# Patient Record
Sex: Female | Born: 1965 | Race: Black or African American | Hispanic: No | Smoking: Never smoker
Health system: Southern US, Community
[De-identification: ages and names within clinical notes are randomized; demographics above are authoritative.]

## PROBLEM LIST (undated history)

## (undated) DIAGNOSIS — I10 Essential (primary) hypertension: Secondary | ICD-10-CM

## (undated) DIAGNOSIS — K219 Gastro-esophageal reflux disease without esophagitis: Secondary | ICD-10-CM

## (undated) DIAGNOSIS — T7840XA Allergy, unspecified, initial encounter: Secondary | ICD-10-CM

## (undated) HISTORY — PX: TUBAL LIGATION: SHX77

## (undated) HISTORY — DX: Essential (primary) hypertension: I10

## (undated) HISTORY — DX: Allergy, unspecified, initial encounter: T78.40XA

## (undated) SURGERY — COLONOSCOPY
Anesthesia: Moderate Sedation

---

## 2001-10-27 ENCOUNTER — Other Ambulatory Visit: Admission: RE | Admit: 2001-10-27 | Discharge: 2001-10-27 | Payer: Self-pay | Admitting: Family Medicine

## 2003-06-03 ENCOUNTER — Encounter: Payer: Self-pay | Admitting: Emergency Medicine

## 2003-06-03 ENCOUNTER — Emergency Department (HOSPITAL_COMMUNITY): Admission: EM | Admit: 2003-06-03 | Discharge: 2003-06-03 | Payer: Self-pay | Admitting: Emergency Medicine

## 2003-09-20 ENCOUNTER — Encounter: Payer: Self-pay | Admitting: Family Medicine

## 2003-09-20 ENCOUNTER — Ambulatory Visit (HOSPITAL_COMMUNITY): Admission: RE | Admit: 2003-09-20 | Discharge: 2003-09-20 | Payer: Self-pay | Admitting: Family Medicine

## 2004-11-17 ENCOUNTER — Ambulatory Visit: Payer: Self-pay | Admitting: Family Medicine

## 2005-01-06 ENCOUNTER — Ambulatory Visit: Payer: Self-pay | Admitting: Family Medicine

## 2005-02-05 ENCOUNTER — Ambulatory Visit: Payer: Self-pay | Admitting: Family Medicine

## 2005-07-01 ENCOUNTER — Ambulatory Visit: Payer: Self-pay | Admitting: Family Medicine

## 2005-07-05 ENCOUNTER — Emergency Department: Payer: Self-pay | Admitting: Unknown Physician Specialty

## 2005-07-05 ENCOUNTER — Other Ambulatory Visit: Payer: Self-pay

## 2005-07-08 ENCOUNTER — Ambulatory Visit: Payer: Self-pay | Admitting: Family Medicine

## 2006-01-21 ENCOUNTER — Ambulatory Visit: Payer: Self-pay | Admitting: Family Medicine

## 2006-03-13 ENCOUNTER — Encounter (INDEPENDENT_AMBULATORY_CARE_PROVIDER_SITE_OTHER): Payer: Self-pay | Admitting: *Deleted

## 2006-03-13 LAB — CONVERTED CEMR LAB: Pap Smear: NORMAL

## 2006-03-23 ENCOUNTER — Ambulatory Visit: Payer: Self-pay | Admitting: Family Medicine

## 2006-03-23 ENCOUNTER — Other Ambulatory Visit: Admission: RE | Admit: 2006-03-23 | Discharge: 2006-03-23 | Payer: Self-pay | Admitting: Family Medicine

## 2006-03-23 ENCOUNTER — Encounter (INDEPENDENT_AMBULATORY_CARE_PROVIDER_SITE_OTHER): Payer: Self-pay | Admitting: *Deleted

## 2006-05-17 ENCOUNTER — Ambulatory Visit: Payer: Self-pay | Admitting: Family Medicine

## 2006-06-28 ENCOUNTER — Emergency Department (HOSPITAL_COMMUNITY): Admission: EM | Admit: 2006-06-28 | Discharge: 2006-06-29 | Payer: Self-pay | Admitting: Emergency Medicine

## 2006-11-02 ENCOUNTER — Ambulatory Visit: Payer: Self-pay | Admitting: Family Medicine

## 2006-11-08 ENCOUNTER — Ambulatory Visit (HOSPITAL_COMMUNITY): Admission: RE | Admit: 2006-11-08 | Discharge: 2006-11-08 | Payer: Self-pay | Admitting: Family Medicine

## 2007-06-21 ENCOUNTER — Ambulatory Visit: Payer: Self-pay | Admitting: Family Medicine

## 2007-07-25 ENCOUNTER — Ambulatory Visit: Payer: Self-pay | Admitting: Family Medicine

## 2007-07-25 ENCOUNTER — Encounter: Payer: Self-pay | Admitting: Family Medicine

## 2007-07-25 ENCOUNTER — Other Ambulatory Visit: Admission: RE | Admit: 2007-07-25 | Discharge: 2007-07-25 | Payer: Self-pay | Admitting: Family Medicine

## 2007-07-25 ENCOUNTER — Encounter (INDEPENDENT_AMBULATORY_CARE_PROVIDER_SITE_OTHER): Payer: Self-pay | Admitting: *Deleted

## 2007-07-25 LAB — CONVERTED CEMR LAB
BUN: 19 mg/dL (ref 6–23)
Basophils Relative: 1 % (ref 0–1)
Calcium: 9.7 mg/dL (ref 8.4–10.5)
Chloride: 101 meq/L (ref 96–112)
HCT: 38.1 %
HCT: 38.1 % (ref 36.0–46.0)
Hemoglobin: 11.6 g/dL
Hemoglobin: 11.6 g/dL — ABNORMAL LOW (ref 12.0–15.0)
LDL Cholesterol: 109 mg/dL — ABNORMAL HIGH (ref 0–99)
MCHC: 30.4 g/dL
MCHC: 30.4 g/dL (ref 30.0–36.0)
MCV: 71.8 fL — ABNORMAL LOW (ref 78.0–100.0)
Monocytes Relative: 5 % (ref 3–11)
Platelets: 338 10*3/uL
Platelets: 338 10*3/uL (ref 150–400)
Potassium: 3.9 meq/L (ref 3.5–5.3)
RBC: 5.31 M/uL
RDW: 14.8 %
Triglycerides: 64 mg/dL (ref <150)
VLDL: 13 mg/dL (ref 0–40)
WBC: 6.6 10*3/uL
WBC: 6.6 10*3/uL (ref 4.0–10.5)

## 2007-07-26 ENCOUNTER — Encounter: Payer: Self-pay | Admitting: Family Medicine

## 2007-07-26 LAB — CONVERTED CEMR LAB
Folate: 5.1 ng/mL
Iron: 83 ug/dL (ref 42–145)
Retic Count, Absolute: 63.4 (ref 19.0–186.0)
Saturation Ratios: 27 % (ref 20–55)
UIBC: 222 ug/dL

## 2007-11-15 ENCOUNTER — Ambulatory Visit: Payer: Self-pay | Admitting: Family Medicine

## 2007-11-17 ENCOUNTER — Ambulatory Visit (HOSPITAL_COMMUNITY): Admission: RE | Admit: 2007-11-17 | Discharge: 2007-11-17 | Payer: Self-pay | Admitting: Family Medicine

## 2007-12-19 ENCOUNTER — Encounter: Payer: Self-pay | Admitting: Family Medicine

## 2007-12-19 DIAGNOSIS — N949 Unspecified condition associated with female genital organs and menstrual cycle: Secondary | ICD-10-CM

## 2007-12-19 DIAGNOSIS — I10 Essential (primary) hypertension: Secondary | ICD-10-CM | POA: Insufficient documentation

## 2007-12-19 DIAGNOSIS — D649 Anemia, unspecified: Secondary | ICD-10-CM | POA: Insufficient documentation

## 2007-12-19 DIAGNOSIS — R5383 Other fatigue: Secondary | ICD-10-CM

## 2007-12-19 DIAGNOSIS — N938 Other specified abnormal uterine and vaginal bleeding: Secondary | ICD-10-CM | POA: Insufficient documentation

## 2007-12-19 DIAGNOSIS — R5381 Other malaise: Secondary | ICD-10-CM | POA: Insufficient documentation

## 2007-12-19 DIAGNOSIS — J309 Allergic rhinitis, unspecified: Secondary | ICD-10-CM | POA: Insufficient documentation

## 2007-12-19 DIAGNOSIS — E669 Obesity, unspecified: Secondary | ICD-10-CM | POA: Insufficient documentation

## 2007-12-19 DIAGNOSIS — G43909 Migraine, unspecified, not intractable, without status migrainosus: Secondary | ICD-10-CM | POA: Insufficient documentation

## 2008-04-17 ENCOUNTER — Ambulatory Visit: Payer: Self-pay | Admitting: Family Medicine

## 2008-04-23 ENCOUNTER — Encounter (INDEPENDENT_AMBULATORY_CARE_PROVIDER_SITE_OTHER): Payer: Self-pay | Admitting: *Deleted

## 2008-07-25 ENCOUNTER — Ambulatory Visit: Payer: Self-pay | Admitting: Family Medicine

## 2008-07-25 ENCOUNTER — Encounter: Payer: Self-pay | Admitting: Family Medicine

## 2008-07-25 ENCOUNTER — Other Ambulatory Visit: Admission: RE | Admit: 2008-07-25 | Discharge: 2008-07-25 | Payer: Self-pay | Admitting: Family Medicine

## 2008-07-25 DIAGNOSIS — M1710 Unilateral primary osteoarthritis, unspecified knee: Secondary | ICD-10-CM

## 2008-07-25 DIAGNOSIS — IMO0002 Reserved for concepts with insufficient information to code with codable children: Secondary | ICD-10-CM | POA: Insufficient documentation

## 2008-07-26 ENCOUNTER — Encounter: Payer: Self-pay | Admitting: Family Medicine

## 2008-07-26 LAB — CONVERTED CEMR LAB: Retic Ct Pct: 1.1 % (ref 0.4–3.1)

## 2008-07-28 LAB — CONVERTED CEMR LAB
ALT: 12 units/L (ref 0–35)
AST: 16 units/L (ref 0–37)
Albumin: 4.1 g/dL (ref 3.5–5.2)
Alkaline Phosphatase: 62 units/L (ref 39–117)
Calcium: 8.8 mg/dL (ref 8.4–10.5)
Eosinophils Absolute: 0.1 10*3/uL (ref 0.0–0.7)
Eosinophils Relative: 2 % (ref 0–5)
Glucose, Bld: 94 mg/dL (ref 70–99)
Hemoglobin: 10.8 g/dL — ABNORMAL LOW (ref 12.0–15.0)
Indirect Bilirubin: 0.3 mg/dL (ref 0.0–0.9)
Lymphs Abs: 2.5 10*3/uL (ref 0.7–4.0)
MCV: 71.5 fL — ABNORMAL LOW (ref 78.0–100.0)
Monocytes Absolute: 0.4 10*3/uL (ref 0.1–1.0)
Neutro Abs: 2.3 10*3/uL (ref 1.7–7.7)
RBC: 5.02 M/uL (ref 3.87–5.11)
Total Bilirubin: 0.4 mg/dL (ref 0.3–1.2)
Total CHOL/HDL Ratio: 2.7
WBC: 5.3 10*3/uL (ref 4.0–10.5)

## 2008-09-28 ENCOUNTER — Emergency Department: Payer: Self-pay | Admitting: Emergency Medicine

## 2008-11-19 ENCOUNTER — Ambulatory Visit: Payer: Self-pay | Admitting: Family Medicine

## 2009-03-05 ENCOUNTER — Ambulatory Visit: Payer: Self-pay | Admitting: Family Medicine

## 2009-03-09 DIAGNOSIS — J0110 Acute frontal sinusitis, unspecified: Secondary | ICD-10-CM | POA: Insufficient documentation

## 2009-03-11 ENCOUNTER — Ambulatory Visit (HOSPITAL_COMMUNITY): Admission: RE | Admit: 2009-03-11 | Discharge: 2009-03-11 | Payer: Self-pay | Admitting: Family Medicine

## 2009-06-02 ENCOUNTER — Encounter: Payer: Self-pay | Admitting: Family Medicine

## 2009-07-15 ENCOUNTER — Encounter: Payer: Self-pay | Admitting: Family Medicine

## 2009-07-16 ENCOUNTER — Encounter: Payer: Self-pay | Admitting: Family Medicine

## 2009-07-16 LAB — CONVERTED CEMR LAB
BUN: 11 mg/dL (ref 6–23)
Basophils Absolute: 0 10*3/uL (ref 0.0–0.1)
CO2: 24 meq/L (ref 19–32)
Eosinophils Absolute: 0.1 10*3/uL (ref 0.0–0.7)
Glucose, Bld: 93 mg/dL (ref 70–99)
HCT: 35.8 % — ABNORMAL LOW (ref 36.0–46.0)
Hemoglobin: 10.6 g/dL — ABNORMAL LOW (ref 12.0–15.0)
Lymphs Abs: 2.3 10*3/uL (ref 0.7–4.0)
Monocytes Relative: 7 % (ref 3–12)
Potassium: 4.5 meq/L (ref 3.5–5.3)
RBC: 4.94 M/uL (ref 3.87–5.11)
Total CHOL/HDL Ratio: 2.3
WBC: 5.8 10*3/uL (ref 4.0–10.5)

## 2009-07-29 ENCOUNTER — Ambulatory Visit: Payer: Self-pay | Admitting: Family Medicine

## 2009-07-29 ENCOUNTER — Encounter: Payer: Self-pay | Admitting: Family Medicine

## 2009-07-29 ENCOUNTER — Other Ambulatory Visit: Admission: RE | Admit: 2009-07-29 | Discharge: 2009-07-29 | Payer: Self-pay | Admitting: Family Medicine

## 2009-07-29 DIAGNOSIS — E049 Nontoxic goiter, unspecified: Secondary | ICD-10-CM | POA: Insufficient documentation

## 2009-08-01 ENCOUNTER — Ambulatory Visit (HOSPITAL_COMMUNITY): Admission: RE | Admit: 2009-08-01 | Discharge: 2009-08-01 | Payer: Self-pay | Admitting: Family Medicine

## 2009-12-30 ENCOUNTER — Ambulatory Visit: Payer: Self-pay | Admitting: Family Medicine

## 2010-01-11 DIAGNOSIS — F4321 Adjustment disorder with depressed mood: Secondary | ICD-10-CM | POA: Insufficient documentation

## 2010-03-13 ENCOUNTER — Ambulatory Visit (HOSPITAL_COMMUNITY): Admission: RE | Admit: 2010-03-13 | Discharge: 2010-03-13 | Payer: Self-pay | Admitting: Family Medicine

## 2010-03-18 ENCOUNTER — Ambulatory Visit: Payer: Self-pay | Admitting: Family Medicine

## 2010-03-23 DIAGNOSIS — IMO0002 Reserved for concepts with insufficient information to code with codable children: Secondary | ICD-10-CM | POA: Insufficient documentation

## 2010-07-31 ENCOUNTER — Other Ambulatory Visit: Admission: RE | Admit: 2010-07-31 | Discharge: 2010-07-31 | Payer: Self-pay | Admitting: Family Medicine

## 2010-07-31 ENCOUNTER — Ambulatory Visit: Payer: Self-pay | Admitting: Family Medicine

## 2010-07-31 LAB — CONVERTED CEMR LAB: OCCULT 1: NEGATIVE

## 2010-08-04 ENCOUNTER — Encounter: Payer: Self-pay | Admitting: Family Medicine

## 2010-08-04 LAB — CONVERTED CEMR LAB
BUN: 16 mg/dL (ref 6–23)
Basophils Absolute: 0 10*3/uL (ref 0.0–0.1)
Chloride: 103 meq/L (ref 96–112)
Cholesterol: 140 mg/dL (ref 0–200)
Creatinine, Ser: 0.57 mg/dL (ref 0.40–1.20)
Eosinophils Relative: 2 % (ref 0–5)
Glucose, Bld: 95 mg/dL (ref 70–99)
HDL: 62 mg/dL (ref >39)
LDL Cholesterol: 69 mg/dL (ref 0–99)
Lymphocytes Relative: 38 % (ref 12–46)
Lymphs Abs: 2.8 10*3/uL (ref 0.7–4.0)
MCV: 72.3 fL — ABNORMAL LOW (ref 78.0–100.0)
Monocytes Absolute: 0.5 10*3/uL (ref 0.1–1.0)
Monocytes Relative: 7 % (ref 3–12)
Potassium: 3.8 meq/L (ref 3.5–5.3)
RBC: 4.87 M/uL (ref 3.87–5.11)
RDW: 15.1 % (ref 11.5–15.5)
Total CHOL/HDL Ratio: 2.3
WBC: 7.5 10*3/uL (ref 4.0–10.5)

## 2010-08-05 ENCOUNTER — Encounter: Payer: Self-pay | Admitting: Family Medicine

## 2010-08-05 LAB — CONVERTED CEMR LAB
Folate: 11.1 ng/mL
TIBC: 289 ug/dL (ref 250–470)
UIBC: 213 ug/dL
Vitamin B-12: 974 pg/mL — ABNORMAL HIGH (ref 211–911)

## 2010-08-06 ENCOUNTER — Encounter: Payer: Self-pay | Admitting: Physician Assistant

## 2010-11-24 ENCOUNTER — Ambulatory Visit: Payer: Self-pay | Admitting: Family Medicine

## 2010-12-27 ENCOUNTER — Encounter: Payer: Self-pay | Admitting: Family Medicine

## 2011-01-07 NOTE — Assessment & Plan Note (Signed)
Summary: physical   Vital Signs:  Patient profile:   45 year old female Menstrual status:  regular Height:      63 inches Weight:      185.75 pounds BMI:     33.02 O2 Sat:      98 % on Room air Pulse rate:   91 / minute Pulse rhythm:   regular Resp:     16 per minute BP sitting:   122 / 88  (left arm)  Vitals Entered By: Adella Hare LPN (July 31, 2010 9:00 AM)  Nutrition Counseling: Patient's BMI is greater than 25 and therefore counseled on weight management options.  O2 Flow:  Room air CC: physical Is Patient Diabetic? No Pain Assessment Patient in pain? no       Vision Screening:Left eye w/o correction: 20 / 15 Right Eye w/o correction: 20 / 25 Both eyes w/o correction:  20/ 13  Color vision testing: normal      Vision Entered By: Adella Hare LPN (July 31, 2010 9:01 AM)   CC:  physical.  History of Present Illness: Reports  that she has been doing fairly well. Apart from being under excessive stress because of the behavior of one of her children, she has no real concerns. 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 Denies recent fever or chills. Denies sinus pressure, nasal congestion , ear pain or sore throat. Denies chest congestion, or cough productive of sputum. Denies chest pain, palpitations, PND, orthopnea or leg swelling. Denies abdominal pain, nausea, vomitting, diarrhea or constipation. Denies change in bowel movements or bloody stool. Denies dysuria , frequency, incontinence or hesitancy. Denies  joint pain, swelling, or reduced mobility. Denies headaches, vertigo, seizures.  Denies  rash, lesions, or itch.     Current Medications (verified): 1)  Zestoretic 20-12.5 Mg Tabs (Lisinopril-Hydrochlorothiazide) .... One and A Half Tablets Every Morning 2)  Claritin 10 Mg Tabs (Loratadine) .... Take 1 Tablet By Mouth Once A Day  Allergies (verified): No Known Drug Allergies  Review of Systems      See  HPI General:  Complains of fatigue and sleep disorder. Eyes:  Denies blurring, discharge, eye pain, and red eye. Endo:  Denies cold intolerance, excessive thirst, excessive urination, and heat intolerance. Heme:  Denies abnormal bruising and bleeding. Allergy:  Complains of seasonal allergies; denies hives or rash and itching eyes.  Physical Exam  General:  Well-developed,well-nourished,in no acute distress; alert,appropriate and cooperative throughout examination Head:  Normocephalic and atraumatic without obvious abnormalities. No apparent alopecia or balding. Eyes:  No corneal or conjunctival inflammation noted. EOMI. Perrla. Funduscopic exam benign, without hemorrhages, exudates or papilledema. Vision grossly normal. Ears:  External ear exam shows no significant lesions or deformities.  Otoscopic examination reveals clear canals, tympanic membranes are intact bilaterally without bulging, retraction, inflammation or discharge. Hearing is grossly normal bilaterally. Nose:  External nasal examination shows no deformity or inflammation. Nasal mucosa are pink and moist without lesions or exudates. Mouth:  pharynx pink and moist and fair dentition.   Neck:  No deformities, masses, or tenderness noted. Chest Wall:  No deformities, masses, or tenderness noted. Breasts:  No mass, nodules, thickening, tenderness, bulging, retraction, inflamation, nipple discharge or skin changes noted.   Lungs:  Normal respiratory effort, chest expands symmetrically. Lungs are clear to auscultation, no crackles or wheezes. Heart:  Normal rate and regular rhythm. S1 and S2 normal without gallop, murmur, click, rub or other extra sounds. Abdomen:  Bowel sounds  positive,abdomen soft and non-tender without masses, organomegaly or hernias noted. Rectal:  No external abnormalities noted. Normal sphincter tone. No rectal masses or tenderness.guaic neg stoolGuaic nefggative stool Genitalia:  Normal introitus for age, no  external lesions, no vaginal discharge, mucosa pink and moist, no vaginal or cervical lesions, no vaginal atrophy, no friaility or hemorrhage, normal uterus size and position, no adnexal masses or tenderness Msk:  No deformity or scoliosis noted of thoracic or lumbar spine.   Pulses:  R and L carotid,radial,femoral,dorsalis pedis and posterior tibial pulses are full and equal bilaterally Extremities:  No clubbing, cyanosis, edema, or deformity noted with normal full range of motion of all joints.   Neurologic:  No cranial nerve deficits noted. Station and gait are normal. Plantar reflexes are down-going bilaterally. DTRs are symmetrical throughout. Sensory, motor and coordinative functions appear intact. Skin:  Intact without suspicious lesions or rashes Cervical Nodes:  No lymphadenopathy noted Axillary Nodes:  No palpable lymphadenopathy Inguinal Nodes:  No significant adenopathy Psych:  Cognition and judgment appear intact. Alert and cooperative with normal attention span and concentration. No apparent delusions, illusions, hallucinations   Impression & Recommendations:  Problem # 1:  HYPERTENSION (ICD-401.9) Assessment Improved  Her updated medication list for this problem includes:    Zestoretic 20-12.5 Mg Tabs (Lisinopril-hydrochlorothiazide) ..... One and a half tablets every morning  BP today: 122/88 Prior BP: 140/90 (03/18/2010)  Labs Reviewed: K+: 4.5 (07/15/2009) Creat: : 0.54 (07/15/2009)   Chol: 150 (07/15/2009)   HDL: 64 (07/15/2009)   LDL: 76 (07/15/2009)   TG: 48 (07/15/2009)  Problem # 2:  ALLERGIC RHINITIS (ICD-477.9) Assessment: Improved  Her updated medication list for this problem includes:    Claritin 10 Mg Tabs (Loratadine) .Marland Kitchen... Take 1 tablet by mouth once a day  Problem # 3:  OBESITY (ICD-278.00) Assessment: Improved  Ht: 63 (07/31/2010)   Wt: 185.75 (07/31/2010)   BMI: 33.02 (07/31/2010)  Complete Medication List: 1)  Zestoretic 20-12.5 Mg Tabs  (Lisinopril-hydrochlorothiazide) .... One and a half tablets every morning 2)  Claritin 10 Mg Tabs (Loratadine) .... Take 1 tablet by mouth once a day  Other Orders: Hemoccult Guaiac-1 spec.(in office) ([id number]) Pap Smear ([address])  Patient Instructions: 1)  Please schedule a follow-up appointment in 4.5 months. 2)  It is important that you exercise regularly at least 40 minutes 6 times a week. If you develop chest pain, have severe difficulty breathing, or feel very tired , stop exercising immediately and seek medical attention. 3)  You need to lose weight. Consider a lower calorie diet and regular exercise. Congrats on weight loss 4)  I hope that things improve in your life soon. 5)  Keep up the softball!!   Laboratory Results  Date/Time Received: July 31, 2010 10:34 AM  Date/Time Reported: July 31, 2010 10:34 AM   Stool - Occult Blood Hemmoccult #1: negative Date: 07/31/2010 Comments: 50201 10L 2/13 118 10/12

## 2011-01-07 NOTE — Assessment & Plan Note (Signed)
Summary: office visit   Vital Signs:  Patient profile:   45 year old female Menstrual status:  regular Height:      63 inches Weight:      198.75 pounds BMI:     35.33 O2 Sat:      95 % on Room air Pulse rate:   96 / minute Pulse rhythm:   regular Resp:     16 per minute BP sitting:   120 / 84  (right arm)  Vitals Entered By: Everitt Amber (December 30, 2009 9:52 AM)  Nutrition Counseling: Patient's BMI is greater than 25 and therefore counseled on weight management options.  O2 Flow:  Room air CC: Follow up chronic problems   CC:  Follow up chronic problems.  History of Present Illness: Reports  thatshe has not been doing well since the unexpected loss of herspouse in December. . Denies recent fever or chills. Denies sinus pressure, nasal congestion , ear pain or sore throat. Denies chest congestion, or cough productive of sputum. Denies chest pain, palpitations, PND, orthopnea or leg swelling. Denies abdominal pain, nausea, vomitting, diarrhea or constipation. Denies change in bowel movements or bloody stool. Denies dysuria , frequency, incontinence or hesitancy. Denies  joint pain, swelling, or reduced mobility. Denies headaches, vertigo, seizures. Reports depression, she is not suicidal orhomicidal, sh has family support from her children.. Denies  rash, lesions, or itch.     Preventive Screening-Counseling & Management  Alcohol-Tobacco     Smoking Status: never  Current Medications (verified): 1)  Zestoretic 20-12.5 Mg Tabs (Lisinopril-Hydrochlorothiazide) .... One and A Half Tablets Every Morning  Allergies (verified): No Known Drug Allergies  Social History: widowed in 11/2009 Never Smoked Alcohol use-no Drug use-no Employed 2 children  Review of Systems      See HPI Eyes:  Denies blurring and discharge. Neuro:  Denies headaches, seizures, and sensation of room spinning. Endo:  Denies cold intolerance, excessive hunger, excessive thirst, excessive  urination, heat intolerance, polyuria, and weight change. Heme:  Denies abnormal bruising and bleeding. Allergy:  Denies hives or rash and sneezing.  Physical Exam  General:  Well-developed,well-nourished,in no acute distress; alert,appropriate and cooperative throughout examination HEENT: No facial asymmetry,  EOMI, No sinus tenderness, TM's Clear, oropharynx  pink and moist.   Chest: Clear to auscultation bilaterally.  CVS: S1, S2, No murmurs, No S3.   Abd: Soft, Nontender.  MS: Adequate ROM spine, hips, shoulders and knees.  Ext: No edema.   CNS: CN 2-12 intact, power tone and sensation normal throughout.   Skin: Intact, no visible lesions or rashes.  Psych: Good eye contact, flat affect.  Memory intact,  depressed appearing.    Impression & Recommendations:  Problem # 1:  OBESITY (ICD-278.00) Assessment Unchanged  Ht: 63 (12/30/2009)   Wt: 198.75 (12/30/2009)   BMI: 35.33 (12/30/2009)  Problem # 2:  HYPERTENSION (ICD-401.9) Assessment: Unchanged  Her updated medication list for this problem includes:    Zestoretic 20-12.5 Mg Tabs (Lisinopril-hydrochlorothiazide) ..... One and a half tablets every morning  Orders: T-Basic Metabolic Panel [id number])  BP today: 120/84 Prior BP: 122/80 (07/29/2009)  Labs Reviewed: K+: 4.5 (07/15/2009) Creat: : 0.54 (07/15/2009)   Chol: 150 (07/15/2009)   HDL: 64 (07/15/2009)   LDL: 76 (07/15/2009)   TG: 48 (07/15/2009)  Problem # 3:  MOURNING (ICD-309.0) pt ventillated for approx 10 mins, at this time she does not require  mental health referral  Complete Medication List: 1)  Zestoretic 20-12.5 Mg Tabs (Lisinopril-hydrochlorothiazide) .... One  and a half tablets every morning  Other Orders: T-CBC w/Diff ([id number]) T- * Misc. Laboratory test [id number])  Patient Instructions: 1)  cPE end August after the 26th 2)  It is important that you exercise regularly at least 20 minutes 5 times a week. If you develop chest pain, have  severe difficulty breathing, or feel very tired , stop exercising immediately and seek medical attention. 3)  You need to lose weight. Consider a lower calorie diet and regular exercise.  4)  BMP prior to visit, ICD-9: 5)  CBC w/ Diff prior to visit, ICD-9:   and anemia panel Prescriptions: ZESTORETIC 20-12.5 MG TABS (LISINOPRIL-HYDROCHLOROTHIAZIDE) one and a half tablets every morning  #45 x 6   Entered by:   Everitt Amber   Authorized by:   Syliva Overman MD   Signed by:   Everitt Amber on 12/30/2009   Method used:   Electronically to        [facility] [address] (retail)       [address], [address]       [place], [place]  [address]       Ph: [phone]       Fax: [phone]   RxID:   [id number]

## 2011-01-07 NOTE — Assessment & Plan Note (Signed)
Summary: OV   Vital Signs:  Patient profile:   45 year old female Menstrual status:  regular Height:      63 inches Weight:      194.25 pounds BMI:     34.53 O2 Sat:      97 % on Room air Pulse rate:   101 / minute Pulse rhythm:   regular Resp:     16 per minute BP sitting:   140 / 90  (left arm) Cuff size:   large  Vitals Entered By: Everitt Amber LPN (March 18, 2010 9:45 AM)  Nutrition Counseling: Patient's BMI is greater than 25 and therefore counseled on weight management options.  O2 Flow:  Room air CC: she was at work [place] and she had bent over to pull something at work and she felt a pop in the left side of her lower back and now she has pain there and its going all the way down her left leg Pain Assessment Patient in pain? yes     Location: left back and leg Intensity: 8 Type: aching Onset of pain  wednesday    CC:  she was at work [place] and she had bent over to pull something at work and she felt a pop in the left side of her lower back and now she has pain there and its going all the way down her left leg.  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 She has used ibuprofen , pain keeps her up at night.    Current Medications (verified): 1)  Zestoretic 20-12.5 Mg Tabs (Lisinopril-Hydrochlorothiazide) .... One and A Half Tablets Every Morning  Allergies (verified): No Known Drug Allergies  Review of Systems General:  Complains of fatigue and sleep disorder; denies chills and fever; still grieving her spouse's loss feels lonely often. Eyes:  Denies blurring, discharge, and red eye. ENT:  Complains of nasal congestion and nosebleeds; denies decreased hearing, earache, postnasal drainage, sinus pressure, and sore throat. CV:  Denies chest pain or discomfort,  palpitations, shortness of breath with exertion, and swelling of feet. Resp:  Denies cough and sputum productive. GI:  Denies abdominal pain, constipation, diarrhea, nausea, and vomiting. GU:  Denies dysuria and urinary frequency. MS:  Complains of low back pain; left SI joint pain radiating down post left buttock to mudthigh  x 1 week after hurting her back on the job, Some numbness and weakness reported. Derm:  Denies itching and rash. Neuro:  Denies headaches, seizures, and sensation of room spinning. Psych:  Complains of anxiety and depression; denies irritability, mental problems, suicidal thoughts/plans, thoughts of violence, and unusual visions or sounds. Endo:  Denies cold intolerance, excessive hunger, excessive thirst, excessive urination, heat intolerance, polyuria, and weight change. Heme:  Denies abnormal bruising and bleeding. Allergy:  Complains of itching eyes and seasonal allergies; runny nose allergic to pollen.  Physical Exam  General:  Well-developed,well-nourished,in no acute distress; alert,appropriate and cooperative throughout examination HEENT: No facial asymmetry,  EOMI, No sinus tenderness, TM's Clear, oropharynx  pink and moist.   Chest: Clear to auscultation bilaterally.  CVS: S1, S2, No murmurs, No S3.   Abd: Soft, Nontender.  ZO:[id number]  ROM lumbosacral  spine,ADEQUATE in  hips, shoulders and knees.  Ext: No edema.   CNS: CN 2-12 intact, power  tone and sensation normal throughout.   Skin: Intact, no visible lesions or rashes.  Psych: Good eye contact, normal affect.  Memory intact, not anxious or depressed appearing.    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 One and a half tablets every morning  Orders: T-Basic Metabolic Panel [id number])  BP today: 140/90, probably due to pain and lack of sleep Prior BP: 120/84 (12/30/2009)  Labs Reviewed: K+: 4.5 (07/15/2009) Creat: : 0.54 (07/15/2009)   Chol: 150 (07/15/2009)   HDL: 64 (07/15/2009)   LDL: 76 (07/15/2009)   TG: 48 (07/15/2009)  Problem # 3:  OBESITY (ICD-278.00) Assessment: Unchanged  Ht: 63 (03/18/2010)   Wt: 194.25 (03/18/2010)   BMI: 34.53 (03/18/2010)  Complete Medication List: 1)  Zestoretic 20-12.5 Mg Tabs (Lisinopril-hydrochlorothiazide) .... One and a half tablets every morning 2)  Claritin 10 Mg Tabs (Loratadine) .... Take 1 tablet by mouth once a day 3)  Prednisone (pak) 5 Mg Tabs (Prednisone) .... Use as directed 4)  Ibuprofen 800 Mg Tabs (Ibuprofen) .... Take 1 tablet by mouth three times a day 5)  Tramadol Hcl 50 Mg Tabs (Tramadol hcl) .... Take 1 tab by mouth at bedtime as needed  Other Orders: T-Lipid Profile ([id number]) T-CBC w/Diff ([id number]) T-TSH [id number])  Patient Instructions: 1)  CPE due 8/25 or after. 2)  You are being treated for acute sciatic pain. 3)  pls take all meds prescribed. 4)  Pls call iof symptoms worsen 5)  It is important that you exercise regularly at least 20 minutes 5 times a week. If you develop chest pain, have severe difficulty breathing, or feel very tired , stop exercising immediately and seek medical attention. 6)  You need to lose weight. Consider a lower calorie diet and regular exercise.  7)  BMP prior to visit, ICD-9: 8)  Lipid Panel prior to visit, ICD-9: 9)  TSH prior to visit, ICD-9:   fastinglabs mid August 10)  CBC w/ Diff prior to visit, ICD-9: Prescriptions: TRAMADOL HCL 50 MG TABS (TRAMADOL HCL) Take 1 tab by mouth at bedtime as needed  #30 x 0   Entered and Authorized by:   Syliva Overman MD   Signed by:   Syliva Overman MD on 03/18/2010   Method used:    Electronically to        [facility] [address] (retail)       [address], [address]       [place], [place]  [address]       Ph: [phone]       Fax: [phone]   RxID:   [id number] IBUPROFEN 800 MG TABS (IBUPROFEN) Take 1 tablet by mouth three times a day  #30 x 0   Entered and Authorized by:   Syliva Overman MD   Signed by:   Syliva Overman MD on 03/18/2010   Method used:   Electronically to        [facility] [address] (retail)       [address], [address]       [place], [place]  [address]       Ph: [phone]  Fax: [phone]   RxID:   [id number] PREDNISONE (PAK) 5 MG TABS (PREDNISONE) Use as directed  #21 x 0   Entered and Authorized by:   Syliva Overman MD   Signed by:   Syliva Overman MD on 03/18/2010   Method used:   Electronically to        [facility] [address] (retail)       [address], [address]       [place], [place]  [address]       Ph: [phone]       Fax: [phone]   RxID:   [id number] CLARITIN 10 MG TABS (LORATADINE) Take 1 tablet by mouth once a day  #30 x 3   Entered and Authorized by:   Syliva Overman MD   Signed by:   Syliva Overman MD on 03/18/2010   Method used:   Electronically to        [facility] [address] (retail)       [address], [address]       [place], [place]  [address]       Ph: [phone]       Fax: [phone]   RxID:   [id number]

## 2011-01-07 NOTE — Letter (Signed)
Summary: Pap Smear, Normal Letter, [facility]  [address]   [place], [place] [address]   Phone: [phone]  Fax: [phone]          August 06, 2010    Dear: Kari Mason    I am pleased to notify you that your PAP smear was normal.  You will need your next PAP smear in:     ____ 3 Months    ____ 6 Months    ____ 12 Months    Please call the office at our office number above, to schedule your next appointment.    Sincerely,     [facility]

## 2011-01-07 NOTE — Letter (Signed)
Summary: LAB ADD ON  LAB ADD ON   Imported By: Lind Guest 08/05/2010 13:06:07  _____________________________________________________________________  External Attachment:    Type:   Image     Comment:   External Document

## 2011-01-13 NOTE — Assessment & Plan Note (Signed)
Summary: office visit   Vital Signs:  Patient profile:   45 year old female Menstrual status:  regular Height:      63 inches Weight:      179.50 pounds BMI:     31.91 O2 Sat:      99 % Pulse rate:   80 / minute Resp:     16 per minute BP sitting:   120 / 92  (left arm) Cuff size:   large  Vitals Entered By: Everitt Amber LPN (December 23, 2010 3:40 PM)  Nutrition Counseling: Patient's BMI is greater than 25 and therefore counseled on weight management options. CC: Follow up chronic problems   CC:  Follow up chronic problems.  History of Present Illness: Reports  that she has been   doing well. Denies recent fever or chills.  Denies chest congestion, or cough productive of sputum. Denies chest pain, palpitations, PND, orthopnea or leg swelling. Denies abdominal pain, nausea, vomitting, diarrhea or constipation. Denies change in bowel movements or bloody stool. Denies dysuria , frequency, incontinence or hesitancy. Denies  joint pain, swelling, or reduced mobility. Denies headaches, vertigo, seizures. Denies depression, anxiety or insomnia. Denies  rash, lesions, or itch.     Current Medications (verified): 1)  Zestoretic 20-12.5 Mg Tabs (Lisinopril-Hydrochlorothiazide) .... One and A Half Tablets Every Morning 2)  Claritin 10 Mg Tabs (Loratadine) .... Take 1 Tablet By Mouth Once A Day  Allergies (verified): No Known Drug Allergies  Review of Systems      See HPI Eyes:  Denies blurring and discharge. ENT:  Complains of postnasal drainage and sinus pressure; 4 day h/o frontal presure with green drainage. Endo:  Denies cold intolerance, excessive hunger, excessive thirst, excessive urination, and heat intolerance. Heme:  Denies abnormal bruising and bleeding. Allergy:  Denies hives or rash and itching eyes.  Physical Exam  General:  Well-developed,well-nourished,in no acute distress; alert,appropriate and cooperative throughout examination HEENT: No facial  asymmetry,  EOMI,frontal sinus tenderness, TM's Clear, oropharynx  pink and moist.   Chest: Clear to auscultation bilaterally.  CVS: S1, S2, No murmurs, No S3.   Abd: Soft, Nontender.  MS: Adequate ROM spine, hips, shoulders and knees.  Ext: No edema.   CNS: CN 2-12 intact, power tone and sensation normal throughout.   Skin: Intact, no visible lesions or rashes.  Psych: Good eye contact, normal affect.  Memory intact, not anxious or depressed appearing.    Impression & Recommendations:  Problem # 1:  HYPERTENSION (ICD-401.9) Assessment Unchanged  The following medications were removed from the medication list:    Zestoretic 20-12.5 Mg Tabs (Lisinopril-hydrochlorothiazide) ..... One and a half tablets every morning Her updated medication list for this problem includes:    Zestoretic 20-12.5 Mg Tabs (Lisinopril-hydrochlorothiazide) ..... One and a half tablets once daily  Orders: T-Basic Metabolic Panel [id number])  BP today: 120/92 Prior BP: 122/88 (07/31/2010)  Labs Reviewed: K+: 3.8 (07/31/2010) Creat: : 0.57 (07/31/2010)   Chol: 140 (07/31/2010)   HDL: 62 (07/31/2010)   LDL: 69 (07/31/2010)   TG: 45 (07/31/2010)  Problem # 2:  OBESITY (ICD-278.00) Assessment: Improved  Ht: 63 (12/23/2010)   Wt: 179.50 (12/23/2010)   BMI: 31.91 (12/23/2010) therapeutic lifestyle change discussed and encouraged  Problem # 3:  ACUTE FRONTAL SINUSITIS (ICD-461.1) Assessment: Comment Only  Her updated medication list for this problem includes:    Penicillin V Potassium 500 Mg Tabs (Penicillin v potassium) .Marland Kitchen... Take 1 tablet by mouth three times a day  Complete Medication List:  1)  Claritin 10 Mg Tabs (Loratadine) .... Take 1 tablet by mouth once a day 2)  Zestoretic 20-12.5 Mg Tabs (Lisinopril-hydrochlorothiazide) .... One and a half tablets once daily 3)  Penicillin V Potassium 500 Mg Tabs (Penicillin v potassium) .... Take 1 tablet by mouth three times a day  Other Orders: T-CBC  w/Diff ([id number]) T-Anemia Panel 3  (2904)  Patient Instructions: 1)  Please schedule a follow-up appointment in 4 to  4.5 months. 2)  It is important that you exercise regularly at least 40 minutes 5 times a week. If you develop chest pain, have severe difficulty breathing, or feel very tired , stop exercising immediately and seek medical attention. 3)  You need to lose weight. Consider a lower calorie diet and regular exercise.  4)  CBC w/ Diff prior to visit, ICD-9:  and anemia panel  end Feb 5)  BMP prior to visit, ICD-9: Prescriptions: PENICILLIN V POTASSIUM 500 MG TABS (PENICILLIN V POTASSIUM) Take 1 tablet by mouth three times a day  #30 x 0   Entered and Authorized by:   Syliva Overman MD   Signed by:   Syliva Overman MD on 12/23/2010   Method used:   Electronically to        [facility] [address] (retail)       [address], [address]       [place], [place]  [address]       Ph: [phone]       Fax: [phone]   RxID:   [id number] ZESTORETIC 20-12.5 MG TABS (LISINOPRIL-HYDROCHLOROTHIAZIDE) one and a half tablets once daily  #135 x 1   Entered and Authorized by:   Syliva Overman MD   Signed by:   Syliva Overman MD on 12/23/2010   Method used:   Printed then faxed to ...       [facility] [address] (retail)       [address], [address]       [place], [place]  [address]       Ph: [phone]       Fax: [phone]   RxID:   [id number]    Orders Added: 1)  Est. Patient Level IV [99214] 2)  T-Basic Metabolic Panel [[id number]] 3)  T-CBC w/Diff [[id number]] 4)  T-Anemia Panel 3  [2904]

## 2011-04-28 ENCOUNTER — Ambulatory Visit: Payer: Self-pay | Admitting: Family Medicine

## 2011-06-01 ENCOUNTER — Encounter: Payer: Self-pay | Admitting: Family Medicine

## 2011-06-02 ENCOUNTER — Encounter: Payer: Self-pay | Admitting: Family Medicine

## 2011-06-02 ENCOUNTER — Ambulatory Visit (INDEPENDENT_AMBULATORY_CARE_PROVIDER_SITE_OTHER): Payer: BC Managed Care – PPO | Admitting: Family Medicine

## 2011-06-02 ENCOUNTER — Other Ambulatory Visit: Payer: Self-pay | Admitting: Family Medicine

## 2011-06-02 VITALS — BP 128/82 | HR 60 | Resp 16 | Ht 63.0 in | Wt 181.8 lb

## 2011-06-02 DIAGNOSIS — J309 Allergic rhinitis, unspecified: Secondary | ICD-10-CM

## 2011-06-02 DIAGNOSIS — I10 Essential (primary) hypertension: Secondary | ICD-10-CM

## 2011-06-02 DIAGNOSIS — E669 Obesity, unspecified: Secondary | ICD-10-CM

## 2011-06-02 DIAGNOSIS — Z139 Encounter for screening, unspecified: Secondary | ICD-10-CM

## 2011-06-02 LAB — BASIC METABOLIC PANEL
BUN: 20 mg/dL (ref 6–23)
CO2: 27 mEq/L (ref 19–32)
Chloride: 106 mEq/L (ref 96–112)
Creat: 0.6 mg/dL (ref 0.50–1.10)

## 2011-06-02 MED ORDER — CETIRIZINE HCL 10 MG PO CHEW: 10.0000 mg | CHEWABLE_TABLET | Freq: Every day | ORAL | Status: DC

## 2011-06-02 NOTE — Patient Instructions (Signed)
CPE in 2 to 3months  No med changes  It is important that you exercise regularly at least 30 minutes 5 times a week. If you develop chest pain, have severe difficulty breathing, or feel very tired, stop exercising immediately and seek medical attention    A healthy diet is rich in fruit, vegetables and whole grains. Poultry fish, nuts and beans are a healthy choice for protein rather then red meat. A low sodium diet and drinking 64 ounces of water daily is generally recommended. Oils and sweet should be limited. Carbohydrates especially for those who are diabetic or overweight, should be limited to 34-45 gram per meal. It is important to eat on a regular schedule, at least 3 times daily. Snacks should be primarily fruits, vegetables or nuts.   Med is sent in for allergies

## 2011-06-03 LAB — ANEMIA PANEL
%SAT: 24 % (ref 20–55)
Ferritin: 74 ng/mL (ref 10–291)
Iron: 68 ug/dL (ref 42–145)
Retic Ct Pct: 1.4 % (ref 0.4–2.3)
TIBC: 285 ug/dL (ref 250–470)
UIBC: 217 ug/dL
Vitamin B-12: 629 pg/mL (ref 211–911)

## 2011-06-03 LAB — CBC WITH DIFFERENTIAL/PLATELET
Basophils Relative: 0 % (ref 0–1)
Lymphs Abs: 2.3 10*3/uL (ref 0.7–4.0)
MCHC: 30.3 g/dL (ref 30.0–36.0)
Monocytes Relative: 4 % (ref 3–12)
Platelets: 270 10*3/uL (ref 150–400)
WBC: 5.4 10*3/uL (ref 4.0–10.5)

## 2011-06-06 NOTE — Assessment & Plan Note (Signed)
Unchanged, lifestyle change encouraged to facilitate weight loss 

## 2011-06-06 NOTE — Progress Notes (Signed)
  Subjective:    Patient ID: Kari Mason, female    DOB: [birth year], 45 y.o.   MRN: [id number]  HPI The PT is here for follow up and re-evaluation of chronic medical conditions, medication management and review of recent lab and radiology data.  Preventive health is updated, specifically  Cancer screening, Osteoporosis screening and Immunization.   Questions or concerns regarding consultations or procedures which the PT has had in the interim are  addressed. The PT denies any adverse reactions to current medications since the last visit.  There are no new concerns.  There are no specific complaints except for uncontrolled allergies, requesting med for this, std in the past 4 weeks approximately      Review of Systems Denies recent fever or chills. Denies sinus pressure,  ear pain or sore throat.c/o intermittent nasal congestion with clear drainage, and watery eyes and sneezing Denies chest congestion, productive cough or wheezing. Denies chest pains, palpitations, paroxysmal nocturnal dyspnea, orthopnea and leg swelling Denies abdominal pain, nausea, vomiting,diarrhea or constipation.  Denies rectal bleeding or change in bowel movement. Denies dysuria, frequency, hesitancy or incontinence. Denies joint pain, swelling and limitation in mobility. Denies headaches, seizure, numbness, or tingling. Denies depression, anxiety or insomnia.Today is hard for her as it is her wedding anniversarry and her spouse is deceased Denies skin break down or rash.        Objective:   Physical Exam Patient alert and oriented and in no Cardiopulmonary distress.  HEENT: No facial asymmetry, EOMI, no sinus tenderness, TM's clear, Oropharynx pink and moist.  Neck supple no adenopathy.  Chest: Clear to auscultation bilaterally.  CVS: S1, S2 no murmurs, no S3.  ABD: Soft non tender. Bowel sounds normal.  Ext: No edema  MS: Adequate ROM spine, shoulders, hips and knees.  Skin: Intact, no  ulcerations or rash noted.  Psych: Good eye contact, normal affect. Memory intact not anxious or depressed appearing.  CNS: CN 2-12 intact, power, tone and sensation normal throughout.        Assessment & Plan:

## 2011-06-06 NOTE — Assessment & Plan Note (Signed)
Uncontrolled , med prescribed 

## 2011-06-06 NOTE — Assessment & Plan Note (Signed)
Controlled, no change in medication  

## 2011-06-07 ENCOUNTER — Ambulatory Visit (HOSPITAL_COMMUNITY)
Admission: RE | Admit: 2011-06-07 | Discharge: 2011-06-07 | Disposition: A | Discharge status: Home / Self Care | Payer: BC Managed Care – PPO | Source: Ambulatory Visit | Attending: Family Medicine | Admitting: Family Medicine

## 2011-06-07 DIAGNOSIS — Z1231 Encounter for screening mammogram for malignant neoplasm of breast: Secondary | ICD-10-CM | POA: Insufficient documentation

## 2011-06-07 DIAGNOSIS — Z139 Encounter for screening, unspecified: Secondary | ICD-10-CM

## 2011-06-11 ENCOUNTER — Other Ambulatory Visit: Payer: Self-pay | Admitting: Family Medicine

## 2011-06-11 DIAGNOSIS — R928 Other abnormal and inconclusive findings on diagnostic imaging of breast: Secondary | ICD-10-CM

## 2011-06-23 ENCOUNTER — Ambulatory Visit (HOSPITAL_COMMUNITY)
Admission: RE | Admit: 2011-06-23 | Discharge: 2011-06-23 | Disposition: A | Discharge status: Home / Self Care | Payer: BC Managed Care – PPO | Source: Ambulatory Visit | Attending: Family Medicine | Admitting: Family Medicine

## 2011-06-23 DIAGNOSIS — R928 Other abnormal and inconclusive findings on diagnostic imaging of breast: Secondary | ICD-10-CM | POA: Insufficient documentation

## 2011-08-25 ENCOUNTER — Encounter: Payer: BC Managed Care – PPO | Admitting: Family Medicine

## 2011-09-16 ENCOUNTER — Encounter: Payer: Self-pay | Admitting: Family Medicine

## 2011-09-22 ENCOUNTER — Other Ambulatory Visit (HOSPITAL_COMMUNITY)
Admission: RE | Admit: 2011-09-22 | Discharge: 2011-09-22 | Disposition: A | Discharge status: Home / Self Care | Payer: BC Managed Care – PPO | Source: Ambulatory Visit | Attending: Family Medicine | Admitting: Family Medicine

## 2011-09-22 ENCOUNTER — Ambulatory Visit (INDEPENDENT_AMBULATORY_CARE_PROVIDER_SITE_OTHER): Payer: BC Managed Care – PPO | Admitting: Family Medicine

## 2011-09-22 ENCOUNTER — Encounter: Payer: Self-pay | Admitting: Family Medicine

## 2011-09-22 VITALS — BP 108/80 | HR 74 | Resp 16 | Ht 63.0 in | Wt 186.8 lb

## 2011-09-22 DIAGNOSIS — Z1211 Encounter for screening for malignant neoplasm of colon: Secondary | ICD-10-CM

## 2011-09-22 DIAGNOSIS — Z01419 Encounter for gynecological examination (general) (routine) without abnormal findings: Secondary | ICD-10-CM | POA: Insufficient documentation

## 2011-09-22 DIAGNOSIS — Z79899 Other long term (current) drug therapy: Secondary | ICD-10-CM

## 2011-09-22 DIAGNOSIS — I10 Essential (primary) hypertension: Secondary | ICD-10-CM

## 2011-09-22 DIAGNOSIS — E669 Obesity, unspecified: Secondary | ICD-10-CM

## 2011-09-22 DIAGNOSIS — Z0000 Encounter for general adult medical examination without abnormal findings: Secondary | ICD-10-CM

## 2011-09-22 LAB — HEMOCCULT GUIAC POC 1CARD (OFFICE)

## 2011-09-22 NOTE — Assessment & Plan Note (Signed)
Deteriorated. Patient re-educated about  the importance of commitment to a  minimum of 150 minutes of exercise per week. The importance of healthy food choices with portion control discussed. Encouraged to start a food diary, count calories and to consider  joining a support group. Sample diet sheets offered. Goals set by the patient for the next several months.    

## 2011-09-22 NOTE — Assessment & Plan Note (Signed)
Controlled, no change in medication  

## 2011-09-22 NOTE — Patient Instructions (Addendum)
F/u in 5.5 months  Blood pressure is excellent.  You have gained weight and need to lose it. Aim for 6 pounds in the next 6 months  It is important that you exercise regularly at least 30 minutes 5 times a week. If you develop chest pain, have severe difficulty breathing, or feel very tired, stop exercising immediately and seek medical attention  A healthy diet is rich in fruit, vegetables and whole grains. Poultry fish, nuts and beans are a healthy choice for protein rather then red meat. A low sodium diet and drinking 64 ounces of water daily is generally recommended. Oils and sweet should be limited. Carbohydrates especially for those who are diabetic or overweight, should be limited to 30-45 gram per meal. It is important to eat on a regular schedule, at least 3 times daily. Snacks should be primarily fruits, vegetables or nuts.   Pls get flu vaccine at work  Fasting labs as soon as possible

## 2011-09-23 MED ORDER — CETIRIZINE HCL 10 MG PO TABS: 10.0000 mg | ORAL_TABLET | Freq: Every day | ORAL | Status: DC

## 2011-09-26 NOTE — Progress Notes (Signed)
  Subjective:    Patient ID: Kari Mason, female    DOB: [birth year], 45 y.o.   MRN: [id number]  HPI The PT is here for annual exam and re-evaluation of chronic medical conditions, medication management and review of any available recent lab and radiology data.  Preventive health is updated, specifically  Cancer screening and Immunization.   . The PT denies any adverse reactions to current medications since the last visit.  There are no new concerns.  There are no specific complaints       Review of Systems Denies recent fever or chills. Denies sinus pressure, nasal congestion, ear pain or sore throat. Denies chest congestion, productive cough or wheezing. Denies chest pains, palpitations and leg swelling Denies abdominal pain, nausea, vomiting,diarrhea or constipation.   Denies dysuria, frequency, hesitancy or incontinence. Denies joint pain, swelling and limitation in mobility. Denies headaches, seizures, numbness, or tingling. Denies depression, anxiety or insomnia. Denies skin break down or rash.       Objective:   Physical Exam  Pleasant well nourished female, alert and oriented x 3, in no cardio-pulmonary distress. Afebrile. HEENT No facial trauma or asymetry. Sinuses non tender.  EOMI, PERTL, fundoscopic exam is normal, no hemorhage or exudate.  External ears normal, tympanic membranes clear. Oropharynx moist, no exudate, good dentition. Neck: supple, no adenopathy,JVD or thyromegaly.No bruits.  Chest: Clear to ascultation bilaterally.No crackles or wheezes. Non tender to palpation  Breast: No asymetry,no masses. No nipple discharge or inversion. No axillary or supraclavicular adenopathy  Cardiovascular system; Heart sounds normal,  S1 and  S2 ,no S3.  No murmur, or thrill. Apical beat not displaced Peripheral pulses normal.  Abdomen: Soft, non tender, no organomegaly or masses. No bruits. Bowel sounds normal. No guarding, tenderness or  rebound.  Rectal:  No mass. Guaiac negative stool.  GU: External genitalia normal. No lesions. Vaginal canal normal.No discharge. Uterus normal size, no adnexal masses, no cervical motion or adnexal tenderness.  Musculoskeletal exam: Full ROM of spine, hips , shoulders and knees. No deformity ,swelling or crepitus noted. No muscle wasting or atrophy.   Neurologic: Cranial nerves 2 to 12 intact. Power, tone ,sensation and reflexes normal throughout. No disturbance in gait. No tremor.  Skin: Intact, no ulceration, erythema , scaling or rash noted. Pigmentation normal throughout  Psych; Normal mood and affect. Judgement and concentration normal       Assessment & Plan:

## 2011-09-28 ENCOUNTER — Telehealth: Payer: Self-pay | Admitting: *Deleted

## 2011-09-28 ENCOUNTER — Encounter: Payer: Self-pay | Admitting: *Deleted

## 2011-09-28 NOTE — Telephone Encounter (Signed)
Normal pap letter mailed to patient.

## 2011-09-28 NOTE — Telephone Encounter (Signed)
Message copied by Diamantina Monks on Tue Sep 28, 2011  2:36 PM ------      Message from: Syliva Overman MD E      Created: Mon Sep 27, 2011  2:46 PM       Please advise the patient that pap is normal.

## 2011-10-25 ENCOUNTER — Telehealth: Payer: Self-pay | Admitting: Family Medicine

## 2011-10-25 MED ORDER — LISINOPRIL-HYDROCHLOROTHIAZIDE 20-12.5 MG PO TABS: ORAL_TABLET | ORAL | Status: DC

## 2011-10-25 NOTE — Telephone Encounter (Signed)
Sent in

## 2012-03-22 ENCOUNTER — Ambulatory Visit: Payer: BC Managed Care – PPO | Admitting: Family Medicine

## 2012-04-11 ENCOUNTER — Encounter: Payer: Self-pay | Admitting: Family Medicine

## 2012-04-11 ENCOUNTER — Ambulatory Visit (INDEPENDENT_AMBULATORY_CARE_PROVIDER_SITE_OTHER): Payer: BC Managed Care – PPO | Admitting: Family Medicine

## 2012-04-11 VITALS — BP 138/90 | HR 75 | Resp 16 | Ht 63.0 in | Wt 188.0 lb

## 2012-04-11 DIAGNOSIS — R5383 Other fatigue: Secondary | ICD-10-CM

## 2012-04-11 DIAGNOSIS — R7301 Impaired fasting glucose: Secondary | ICD-10-CM

## 2012-04-11 DIAGNOSIS — E669 Obesity, unspecified: Secondary | ICD-10-CM

## 2012-04-11 DIAGNOSIS — Z79899 Other long term (current) drug therapy: Secondary | ICD-10-CM

## 2012-04-11 DIAGNOSIS — R5381 Other malaise: Secondary | ICD-10-CM

## 2012-04-11 DIAGNOSIS — R7309 Other abnormal glucose: Secondary | ICD-10-CM

## 2012-04-11 DIAGNOSIS — R7302 Impaired glucose tolerance (oral): Secondary | ICD-10-CM

## 2012-04-11 DIAGNOSIS — J309 Allergic rhinitis, unspecified: Secondary | ICD-10-CM

## 2012-04-11 DIAGNOSIS — I10 Essential (primary) hypertension: Secondary | ICD-10-CM

## 2012-04-11 DIAGNOSIS — D649 Anemia, unspecified: Secondary | ICD-10-CM

## 2012-04-11 LAB — LIPID PANEL
HDL: 55 mg/dL (ref >39)
LDL Cholesterol: 96 mg/dL (ref 0–99)
Triglycerides: 45 mg/dL (ref <150)
VLDL: 9 mg/dL (ref 0–40)

## 2012-04-11 LAB — IRON AND TIBC
%SAT: 24 % (ref 20–55)
Iron: 72 ug/dL (ref 42–145)
TIBC: 296 ug/dL (ref 250–470)

## 2012-04-11 LAB — CBC WITH DIFFERENTIAL/PLATELET
HCT: 35.8 % — ABNORMAL LOW (ref 36.0–46.0)
Hemoglobin: 10.9 g/dL — ABNORMAL LOW (ref 12.0–15.0)
Lymphocytes Relative: 45 % (ref 12–46)
Monocytes Absolute: 0.4 10*3/uL (ref 0.1–1.0)
Monocytes Relative: 6 % (ref 3–12)
Neutro Abs: 2.7 10*3/uL (ref 1.7–7.7)
Neutrophils Relative %: 46 % (ref 43–77)
RBC: 5.03 MIL/uL (ref 3.87–5.11)
WBC: 5.8 10*3/uL (ref 4.0–10.5)

## 2012-04-11 LAB — BASIC METABOLIC PANEL
CO2: 26 mEq/L (ref 19–32)
Calcium: 9.3 mg/dL (ref 8.4–10.5)
Creat: 0.63 mg/dL (ref 0.50–1.10)
Sodium: 140 mEq/L (ref 135–145)

## 2012-04-11 LAB — FOLATE: Folate: >20 ng/mL

## 2012-04-11 LAB — HEMOGLOBIN A1C: Mean Plasma Glucose: 117 mg/dL — ABNORMAL HIGH (ref <117)

## 2012-04-11 LAB — TSH: TSH: 1.591 u[IU]/mL (ref 0.350–4.500)

## 2012-04-11 LAB — VITAMIN B12: Vitamin B-12: 775 pg/mL (ref 211–911)

## 2012-04-11 NOTE — Progress Notes (Signed)
  Subjective:    Patient ID: Kari Mason, female    DOB: May 15, 1966, 46 y.o.   MRN: [id number]  HPI The PT is here for follow up and re-evaluation of chronic medical conditions, medication management and review of any available recent lab and radiology data.  Preventive health is updated, specifically  Cancer screening and Immunization.   Questions or concerns regarding consultations or procedures which the PT has had in the interim are  addressed. The PT denies any adverse reactions to current medications since the last visit.  There are no new concerns.  There are no specific complaints       Review of Systems See HPI Denies recent fever or chills. Denies sinus pressure, nasal congestion, ear pain or sore throat. Denies chest congestion, productive cough or wheezing. Denies chest pains, palpitations and leg swelling Denies abdominal pain, nausea, vomiting,diarrhea or constipation.   Denies dysuria, frequency, hesitancy or incontinence. Denies joint pain, swelling and limitation in mobility. Denies headaches, seizures, numbness, or tingling. Denies depression, anxiety or insomnia. Denies skin break down or rash.        Objective:   Physical Exam Patient alert and oriented and in no cardiopulmonary distress.  HEENT: No facial asymmetry, EOMI, no sinus tenderness,  oropharynx pink and moist.  Neck supple no adenopathy.  Chest: Clear to auscultation bilaterally.  CVS: S1, S2 no murmurs, no S3.  ABD: Soft non tender. Bowel sounds normal.  Ext: No edema  MS: Adequate ROM spine, shoulders, hips and knees.  Skin: Intact, no ulcerations or rash noted.  Psych: Good eye contact, normal affect. Memory intact not anxious or depressed appearing.  CNS: CN 2-12 intact, power, tone and sensation normal throughout.        Assessment & Plan:

## 2012-04-11 NOTE — Patient Instructions (Addendum)
CPE in Novemeber  Please call if you need me before  Please change eating and commit to regular exercise to improve your health  Blood pressure is slighttly high today, need to lose 5 to 8 pounds in the next 5 months   cBc and anemia panel, fasting lipid, chem 7 , hBa1C and tsh today   No changes in medication

## 2012-04-16 DIAGNOSIS — R7302 Impaired glucose tolerance (oral): Secondary | ICD-10-CM | POA: Insufficient documentation

## 2012-04-16 NOTE — Assessment & Plan Note (Signed)
The importance of weight loss and lifestyle change stressed, rept HBa1C in 6 month

## 2012-04-16 NOTE — Assessment & Plan Note (Signed)
Deteriorated. Patient re-educated about  the importance of commitment to a  minimum of 150 minutes of exercise per week. The importance of healthy food choices with portion control discussed. Encouraged to start a food diary, count calories and to consider  joining a support group. Sample diet sheets offered. Goals set by the patient for the next several months.    

## 2012-04-16 NOTE — Assessment & Plan Note (Signed)
Elevated diastolic pressure at this visit, has been out of medication for 3 days. Importance of compliance stressed

## 2012-04-16 NOTE — Assessment & Plan Note (Signed)
Increased symptoms with change in season, daily use of medication encouraged

## 2012-05-18 ENCOUNTER — Other Ambulatory Visit: Payer: Self-pay | Admitting: Family Medicine

## 2012-10-17 ENCOUNTER — Encounter: Payer: Self-pay | Admitting: Family Medicine

## 2012-10-17 ENCOUNTER — Other Ambulatory Visit (HOSPITAL_COMMUNITY)
Admission: RE | Admit: 2012-10-17 | Discharge: 2012-10-17 | Disposition: A | Discharge status: Home / Self Care | Payer: BC Managed Care – PPO | Source: Ambulatory Visit | Attending: Family Medicine | Admitting: Family Medicine

## 2012-10-17 ENCOUNTER — Ambulatory Visit (INDEPENDENT_AMBULATORY_CARE_PROVIDER_SITE_OTHER): Payer: BC Managed Care – PPO | Admitting: Family Medicine

## 2012-10-17 VITALS — BP 142/80 | HR 72 | Resp 18 | Ht 63.0 in | Wt 185.1 lb

## 2012-10-17 DIAGNOSIS — Z0000 Encounter for general adult medical examination without abnormal findings: Secondary | ICD-10-CM

## 2012-10-17 DIAGNOSIS — R110 Nausea: Secondary | ICD-10-CM

## 2012-10-17 DIAGNOSIS — Z1151 Encounter for screening for human papillomavirus (HPV): Secondary | ICD-10-CM | POA: Insufficient documentation

## 2012-10-17 DIAGNOSIS — R7309 Other abnormal glucose: Secondary | ICD-10-CM

## 2012-10-17 DIAGNOSIS — Z1211 Encounter for screening for malignant neoplasm of colon: Secondary | ICD-10-CM

## 2012-10-17 DIAGNOSIS — R7302 Impaired glucose tolerance (oral): Secondary | ICD-10-CM

## 2012-10-17 DIAGNOSIS — R5381 Other malaise: Secondary | ICD-10-CM

## 2012-10-17 DIAGNOSIS — Z01419 Encounter for gynecological examination (general) (routine) without abnormal findings: Secondary | ICD-10-CM | POA: Insufficient documentation

## 2012-10-17 LAB — POC HEMOCCULT BLD/STL (OFFICE/1-CARD/DIAGNOSTIC): Fecal Occult Blood, POC: NEGATIVE

## 2012-10-17 MED ORDER — PROMETHAZINE HCL 12.5 MG PO TABS: ORAL_TABLET | ORAL | Status: DC

## 2012-10-17 NOTE — Progress Notes (Signed)
  Subjective:    Patient ID: Kari Mason, female    DOB: [birth year], 46 y.o.   MRN: [id number]  HPI The PT is here for annual exam  and re-evaluation of chronic medical conditions, medication management and review of any available recent lab and radiology data.  Preventive health is updated, specifically  Cancer screening and Immunization.   Questions or concerns regarding consultations or procedures which the PT has had in the interim are  addressed. The PT denies any adverse reactions to current medications since the last visit.  There are no new concerns.  C/o daily nausea, thinks it is aggravated b certain foods, and is opting to watch this some more, wants meds however      Review of Systems See HPI Denies recent fever or chills. Denies sinus pressure, nasal congestion, ear pain or sore throat. Denies chest congestion, productive cough or wheezing. Denies chest pains, palpitations and leg swelling Denies abdominal pain,  vomiting,diarrhea or constipation.   Denies dysuria, frequency, hesitancy or incontinence. Denies joint pain, swelling and limitation in mobility. Denies headaches, seizures, numbness, or tingling. Denies depression, anxiety or insomnia. Denies skin break down or rash.        Objective:   Physical Exam Pleasant well nourished female, alert and oriented x 3, in no cardio-pulmonary distress. Afebrile. HEENT No facial trauma or asymetry. Sinuses non tender.  EOMI, PERTL, fundoscopic exam  no hemorhage or exudate.  External ears normal, tympanic membranes clear. Oropharynx moist, no exudate, fair dentition. Neck: supple, no adenopathy,JVD or thyromegaly.No bruits.  Chest: Clear to ascultation bilaterally.No crackles or wheezes. Non tender to palpation  Breast: No asymetry,no masses. No nipple discharge or inversion. No axillary or supraclavicular adenopathy  Cardiovascular system; Heart sounds normal,  S1 and  S2 ,no S3.  No murmur, or  thrill. Apical beat not displaced Peripheral pulses normal.  Abdomen: Soft, non tender, no organomegaly or masses. No bruits. Bowel sounds normal. No guarding, tenderness or rebound.  Rectal:  No mass. Guaiac negative stool.  GU: External genitalia normal. No lesions. Vaginal canal normal.No discharge. Uterus normal size, no adnexal masses, no cervical motion or adnexal tenderness.  Musculoskeletal exam: Full ROM of spine, hips , shoulders and knees. No deformity ,swelling or crepitus noted. No muscle wasting or atrophy.   Neurologic: Cranial nerves 2 to 12 intact. Power, tone ,sensation and reflexes normal throughout. No disturbance in gait. No tremor.  Skin: Intact, no ulceration, erythema , scaling or rash noted. Pigmentation normal throughout  Psych; Normal mood and affect. Judgement and concentration normal        Assessment & Plan:

## 2012-10-17 NOTE — Patient Instructions (Addendum)
F/u in 6 month  Medication is sent in for nausea , it may make you sleepy, use only if needed  H pylori blood test to evaluate nausea non fasting , as soon as possible, also chem 7 and HBA1C  Remember you need to work on weight loss to prevent diabetes  Mammogram is past due please call and schedule Blood pressure slightly elevated, continue to work on weight loss and commit to daily exercise for at least 30 minutes. Please cut back on salty foods and fried foods

## 2012-10-22 DIAGNOSIS — Z0000 Encounter for general adult medical examination without abnormal findings: Secondary | ICD-10-CM | POA: Insufficient documentation

## 2012-10-22 NOTE — Assessment & Plan Note (Signed)
Annual exam with pelvic, breast and rectal performed an documented. Discussion re healthy lifestyle that incorporates daily execise as well as diet rich in vegetable and fruit was carried out. Flu vaccine has already been administered for this year

## 2013-04-17 ENCOUNTER — Ambulatory Visit: Payer: BC Managed Care – PPO | Admitting: Family Medicine

## 2013-04-18 ENCOUNTER — Ambulatory Visit (INDEPENDENT_AMBULATORY_CARE_PROVIDER_SITE_OTHER): Payer: BC Managed Care – PPO | Admitting: Family Medicine

## 2013-04-18 ENCOUNTER — Encounter: Payer: Self-pay | Admitting: Family Medicine

## 2013-04-18 VITALS — BP 130/80 | HR 86 | Resp 16 | Ht 63.0 in | Wt 191.0 lb

## 2013-04-18 DIAGNOSIS — Z13220 Encounter for screening for lipoid disorders: Secondary | ICD-10-CM

## 2013-04-18 DIAGNOSIS — E669 Obesity, unspecified: Secondary | ICD-10-CM

## 2013-04-18 DIAGNOSIS — R5381 Other malaise: Secondary | ICD-10-CM

## 2013-04-18 DIAGNOSIS — D649 Anemia, unspecified: Secondary | ICD-10-CM

## 2013-04-18 DIAGNOSIS — R7301 Impaired fasting glucose: Secondary | ICD-10-CM

## 2013-04-18 DIAGNOSIS — R5383 Other fatigue: Secondary | ICD-10-CM

## 2013-04-18 DIAGNOSIS — I10 Essential (primary) hypertension: Secondary | ICD-10-CM

## 2013-04-18 DIAGNOSIS — R7302 Impaired glucose tolerance (oral): Secondary | ICD-10-CM

## 2013-04-18 DIAGNOSIS — R7309 Other abnormal glucose: Secondary | ICD-10-CM

## 2013-04-18 DIAGNOSIS — J309 Allergic rhinitis, unspecified: Secondary | ICD-10-CM

## 2013-04-18 LAB — CBC WITH DIFFERENTIAL/PLATELET
Basophils Absolute: 0 10*3/uL (ref 0.0–0.1)
Basophils Relative: 1 % (ref 0–1)
HCT: 35.6 % — ABNORMAL LOW (ref 36.0–46.0)
Lymphocytes Relative: 38 % (ref 12–46)
Monocytes Absolute: 0.3 10*3/uL (ref 0.1–1.0)
Neutro Abs: 3.1 10*3/uL (ref 1.7–7.7)
Neutrophils Relative %: 52 % (ref 43–77)
Platelets: 272 10*3/uL (ref 150–400)
RDW: 15.7 % — ABNORMAL HIGH (ref 11.5–15.5)
WBC: 5.8 10*3/uL (ref 4.0–10.5)

## 2013-04-18 LAB — BASIC METABOLIC PANEL
BUN: 13 mg/dL (ref 6–23)
Calcium: 9.4 mg/dL (ref 8.4–10.5)
Potassium: 4.2 mEq/L (ref 3.5–5.3)
Sodium: 141 mEq/L (ref 135–145)

## 2013-04-18 LAB — HEMOGLOBIN A1C: Hgb A1c MFr Bld: 5.6 % (ref <5.7)

## 2013-04-18 LAB — TSH: TSH: 1.577 u[IU]/mL (ref 0.350–4.500)

## 2013-04-18 NOTE — Assessment & Plan Note (Signed)
Controlled, no change in medication DASH diet and commitment to daily physical activity for a minimum of 30 minutes discussed and encouraged, as a part of hypertension management. The importance of attaining a healthy weight is also discussed.  

## 2013-04-18 NOTE — Assessment & Plan Note (Signed)
Controlled, no change in medication  

## 2013-04-18 NOTE — Patient Instructions (Addendum)
CPE November 15 or after, call if you need me before  HBA1C, chem 7 and CBC  TSH today   Fasting lipid, chem 7 and HBa1C in Novemeber  Blood pressure is excellent  Please work on 10 pound weight loss  Cut back on food portion size and eat healthy food choices, veg, fruit, water  Mammogram needs to be done,please schedule past due

## 2013-04-18 NOTE — Assessment & Plan Note (Signed)
Updated lab needed Patient educated about the importance of limiting  Carbohydrate intake , the need to commit to daily physical activity for a minimum of 30 minutes , and to commit weight loss. The fact that changes in all these areas will reduce or eliminate all together the development of diabetes is stressed.    

## 2013-04-18 NOTE — Assessment & Plan Note (Signed)
updtaed lab needed 

## 2013-04-18 NOTE — Assessment & Plan Note (Signed)
Deteriorated. Patient re-educated about  the importance of commitment to a  minimum of 150 minutes of exercise per week. The importance of healthy food choices with portion control discussed. Encouraged to start a food diary, count calories and to consider  joining a support group. Sample diet sheets offered. Goals set by the patient for the next several months.    

## 2013-04-18 NOTE — Progress Notes (Signed)
  Subjective:    Patient ID: Kari Mason, female    DOB: 1966/02/05, 47 y.o.   MRN: [id number]  HPI The PT is here for follow up and re-evaluation of chronic medical conditions, medication management and review of any available recent lab and radiology data.  Preventive health is updated, specifically  Cancer screening and Immunization.   Questions or concerns regarding consultations or procedures which the PT has had in the interim are  addressed. The PT denies any adverse reactions to current medications since the last visit.  There are no new concerns.  There are no specific complaints       Review of Systems See HPI Denies recent fever or chills. Denies sinus pressure, nasal congestion, ear pain or sore throat. Denies chest congestion, productive cough or wheezing. Denies chest pains, palpitations and leg swelling Denies abdominal pain, nausea, vomiting,diarrhea or constipation.   Denies dysuria, frequency, hesitancy or incontinence. Denies joint pain, swelling and limitation in mobility. Denies headaches, seizures, numbness, or tingling. Denies depression, anxiety or insomnia. Denies skin break down or rash.        Objective:   Physical Exam  Patient alert and oriented and in no cardiopulmonary distress.  HEENT: No facial asymmetry, EOMI, no sinus tenderness,  oropharynx pink and moist.  Neck supple no adenopathy.  Chest: Clear to auscultation bilaterally.  CVS: S1, S2 no murmurs, no S3.  ABD: Soft non tender. Bowel sounds normal.  Ext: No edema  MS: Adequate ROM spine, shoulders, hips and knees.  Skin: Intact, no ulcerations or rash noted.  Psych: Good eye contact, normal affect. Memory intact not anxious or depressed appearing.  CNS: CN 2-12 intact, power, tone and sensation normal throughout.       Assessment & Plan:

## 2013-04-30 ENCOUNTER — Other Ambulatory Visit: Payer: Self-pay | Admitting: Family Medicine

## 2013-10-22 ENCOUNTER — Encounter: Payer: Self-pay | Admitting: Family Medicine

## 2013-10-22 ENCOUNTER — Ambulatory Visit (INDEPENDENT_AMBULATORY_CARE_PROVIDER_SITE_OTHER): Payer: BC Managed Care – PPO | Admitting: Family Medicine

## 2013-10-22 ENCOUNTER — Other Ambulatory Visit (HOSPITAL_COMMUNITY)
Admission: RE | Admit: 2013-10-22 | Discharge: 2013-10-22 | Disposition: A | Discharge status: Home / Self Care | Payer: BC Managed Care – PPO | Source: Ambulatory Visit | Attending: Family Medicine | Admitting: Family Medicine

## 2013-10-22 ENCOUNTER — Encounter (INDEPENDENT_AMBULATORY_CARE_PROVIDER_SITE_OTHER): Payer: Self-pay

## 2013-10-22 VITALS — BP 130/84 | HR 83 | Resp 16 | Ht 63.0 in | Wt 191.4 lb

## 2013-10-22 DIAGNOSIS — Z130 Encounter for screening for diseases of the blood and blood-forming organs and certain disorders involving the immune mechanism: Secondary | ICD-10-CM

## 2013-10-22 DIAGNOSIS — I10 Essential (primary) hypertension: Secondary | ICD-10-CM

## 2013-10-22 DIAGNOSIS — D649 Anemia, unspecified: Secondary | ICD-10-CM

## 2013-10-22 DIAGNOSIS — Z01419 Encounter for gynecological examination (general) (routine) without abnormal findings: Secondary | ICD-10-CM | POA: Insufficient documentation

## 2013-10-22 DIAGNOSIS — Z1211 Encounter for screening for malignant neoplasm of colon: Secondary | ICD-10-CM

## 2013-10-22 DIAGNOSIS — E049 Nontoxic goiter, unspecified: Secondary | ICD-10-CM

## 2013-10-22 DIAGNOSIS — R7302 Impaired glucose tolerance (oral): Secondary | ICD-10-CM

## 2013-10-22 DIAGNOSIS — Z0000 Encounter for general adult medical examination without abnormal findings: Secondary | ICD-10-CM

## 2013-10-22 DIAGNOSIS — R5381 Other malaise: Secondary | ICD-10-CM

## 2013-10-22 DIAGNOSIS — R7309 Other abnormal glucose: Secondary | ICD-10-CM

## 2013-10-22 DIAGNOSIS — Z1321 Encounter for screening for nutritional disorder: Secondary | ICD-10-CM

## 2013-10-22 DIAGNOSIS — Z1239 Encounter for other screening for malignant neoplasm of breast: Secondary | ICD-10-CM

## 2013-10-22 DIAGNOSIS — E669 Obesity, unspecified: Secondary | ICD-10-CM

## 2013-10-22 LAB — POC HEMOCCULT BLD/STL (OFFICE/1-CARD/DIAGNOSTIC)

## 2013-10-22 MED ORDER — LISINOPRIL-HYDROCHLOROTHIAZIDE 20-12.5 MG PO TABS: ORAL_TABLET | ORAL | Status: DC

## 2013-10-22 NOTE — Patient Instructions (Addendum)
F/u in 5.5 month, call if you need me before  Please commit to exercising for 30 minutes every day to improve your health  Please increase intake of fresh/frozen fruit and vegetables for health  Aim to lose 1.5 to 2 pounds per month, for health , so work on reducing portion sizes and eating on a regular schedule  Fasting lipid, chem 7 and vit D this week please if possible  Blood pressure is excellent , no changes in medication   CBc, fasting chem 7, and TSH May 15 or after

## 2013-10-22 NOTE — Assessment & Plan Note (Signed)
Unchanged. Patient re-educated about  the importance of commitment to a  minimum of 150 minutes of exercise per week. The importance of healthy food choices with portion control discussed. Encouraged to start a food diary, count calories and to consider  joining a support group. Sample diet sheets offered. Goals set by the patient for the next several months.    

## 2013-10-22 NOTE — Assessment & Plan Note (Signed)
Annual exam as documented Pt encouraged to commit to regular exercise and reduced intake to facilitate weight loss. Pap sent, and mammogram to be scheduled Fasting labs in 6 month

## 2013-10-22 NOTE — Progress Notes (Signed)
  Subjective:    Patient ID: Kari Mason, female    DOB: [birth year], 47 y.o.   MRN: [id number]  HPI The PT is here for annual exam  and re-evaluation of chronic medical conditions, medication management and review of any available recent lab and radiology data.  Preventive health is updated, specifically  Cancer screening and Immunization. Needs mammogram which will be scheduled before she leaves   The PT denies any adverse reactions to current medications since the last visit.  There are no new concerns.  There are no specific complaints       Review of Systems See HPI Denies recent fever or chills. Denies sinus pressure, nasal congestion, ear pain or sore throat. Denies chest congestion, productive cough or wheezing. Denies chest pains, palpitations and leg swelling Denies abdominal pain, nausea, vomiting,diarrhea or constipation.   Denies dysuria, frequency, hesitancy or incontinence. Denies joint pain, swelling and limitation in mobility. Denies headaches, seizures, numbness, or tingling. Denies depression, anxiety or insomnia. Denies skin break down or rash.        Objective:   Physical Exam Pleasant well nourished female, alert and oriented x 3, in no cardio-pulmonary distress. Afebrile. HEENT No facial trauma or asymetry. Sinuses non tender.  EOMI, PERTL, fundoscopic exam  no hemorhage or exudate.  External ears normal, tympanic membranes clear. Oropharynx moist, no exudate, fairly  good dentition. Neck: supple, no adenopathy,JVD or thyromegaly.No bruits.  Chest: Clear to ascultation bilaterally.No crackles or wheezes. Non tender to palpation  Breast: No asymetry,no masses. No nipple discharge or inversion. No axillary or supraclavicular adenopathy  Cardiovascular system; Heart sounds normal,  S1 and  S2 ,no S3.  No murmur, or thrill. Apical beat not displaced Peripheral pulses normal.  Abdomen: Soft, non tender, no organomegaly or masses. No  bruits. Bowel sounds normal. No guarding, tenderness or rebound.  Rectal:  No mass. Guaiac negative stool.  GU: External genitalia normal. No lesions. Vaginal canal normal.No discharge. Uterus normal size, no adnexal masses, no cervical motion or adnexal tenderness.  Musculoskeletal exam: Full ROM of spine, hips , shoulders and knees. No deformity ,swelling or crepitus noted. No muscle wasting or atrophy.   Neurologic: Cranial nerves 2 to 12 intact. Power, tone ,sensation and reflexes normal throughout. No disturbance in gait. No tremor.  Skin: Intact, no ulceration, erythema , scaling or rash noted. Pigmentation normal throughout  Psych; Normal mood and affect. Judgement and concentration normal        Assessment & Plan:

## 2013-10-22 NOTE — Assessment & Plan Note (Signed)
Controlled, no change in medication DASH diet and commitment to daily physical activity for a minimum of 30 minutes discussed and encouraged, as a part of hypertension management. The importance of attaining a healthy weight is also discussed.  

## 2013-10-22 NOTE — Assessment & Plan Note (Signed)
Patient educated about the importance of limiting  Carbohydrate intake , the need to commit to daily physical activity for a minimum of 30 minutes , and to commit weight loss. The fact that changes in all these areas will reduce or eliminate all together the development of diabetes is stressed.   Updated lab next visit 

## 2013-10-26 ENCOUNTER — Ambulatory Visit (HOSPITAL_COMMUNITY)
Admission: RE | Admit: 2013-10-26 | Discharge: 2013-10-26 | Disposition: A | Discharge status: Home / Self Care | Payer: BC Managed Care – PPO | Source: Ambulatory Visit | Attending: Family Medicine | Admitting: Family Medicine

## 2013-10-26 DIAGNOSIS — Z1239 Encounter for other screening for malignant neoplasm of breast: Secondary | ICD-10-CM

## 2013-10-26 DIAGNOSIS — Z1231 Encounter for screening mammogram for malignant neoplasm of breast: Secondary | ICD-10-CM | POA: Insufficient documentation

## 2014-04-23 ENCOUNTER — Encounter: Payer: Self-pay | Admitting: Family Medicine

## 2014-04-23 ENCOUNTER — Encounter (INDEPENDENT_AMBULATORY_CARE_PROVIDER_SITE_OTHER): Payer: Self-pay

## 2014-04-23 ENCOUNTER — Ambulatory Visit (INDEPENDENT_AMBULATORY_CARE_PROVIDER_SITE_OTHER): Payer: BC Managed Care – PPO | Admitting: Family Medicine

## 2014-04-23 VITALS — BP 124/84 | HR 99 | Resp 16 | Wt 187.8 lb

## 2014-04-23 DIAGNOSIS — D649 Anemia, unspecified: Secondary | ICD-10-CM

## 2014-04-23 DIAGNOSIS — R7309 Other abnormal glucose: Secondary | ICD-10-CM

## 2014-04-23 DIAGNOSIS — I10 Essential (primary) hypertension: Secondary | ICD-10-CM

## 2014-04-23 DIAGNOSIS — K219 Gastro-esophageal reflux disease without esophagitis: Secondary | ICD-10-CM

## 2014-04-23 DIAGNOSIS — R5383 Other fatigue: Secondary | ICD-10-CM

## 2014-04-23 DIAGNOSIS — Z130 Encounter for screening for diseases of the blood and blood-forming organs and certain disorders involving the immune mechanism: Secondary | ICD-10-CM

## 2014-04-23 DIAGNOSIS — Z1321 Encounter for screening for nutritional disorder: Secondary | ICD-10-CM

## 2014-04-23 DIAGNOSIS — R7302 Impaired glucose tolerance (oral): Secondary | ICD-10-CM

## 2014-04-23 DIAGNOSIS — E669 Obesity, unspecified: Secondary | ICD-10-CM

## 2014-04-23 DIAGNOSIS — Z6379 Other stressful life events affecting family and household: Secondary | ICD-10-CM

## 2014-04-23 DIAGNOSIS — Z1329 Encounter for screening for other suspected endocrine disorder: Secondary | ICD-10-CM

## 2014-04-23 DIAGNOSIS — R5381 Other malaise: Secondary | ICD-10-CM

## 2014-04-23 DIAGNOSIS — Z13228 Encounter for screening for other metabolic disorders: Secondary | ICD-10-CM

## 2014-04-23 MED ORDER — ESOMEPRAZOLE MAGNESIUM 40 MG PO CPDR: DELAYED_RELEASE_CAPSULE | ORAL | Status: DC

## 2014-04-23 NOTE — Patient Instructions (Addendum)
Annual exam 11/18/or after, please call if you need me before  I hope that your situation improves soon  Congrats on weight loss, keep it up.  Healthy  Eating , regular exercise and sufficient sleep is very important  New medication for reflux with difficulty swallowing for 2 months only, pls consider the need for upper endoscopy if you continue to have difficulty swallowing  Fasting lipid, chem 7 ,hBA1C, CBC, tSH and vit D this week

## 2014-04-25 ENCOUNTER — Ambulatory Visit: Payer: BC Managed Care – PPO | Admitting: Family Medicine

## 2014-04-26 LAB — CBC
HCT: 35.6 % — ABNORMAL LOW (ref 36.0–46.0)
Hemoglobin: 11.2 g/dL — ABNORMAL LOW (ref 12.0–15.0)
MCH: 22 pg — ABNORMAL LOW (ref 26.0–34.0)
MCHC: 31.5 g/dL (ref 30.0–36.0)
MCV: 69.9 fL — ABNORMAL LOW (ref 78.0–100.0)
Platelets: 312 10*3/uL (ref 150–400)
RBC: 5.09 MIL/uL (ref 3.87–5.11)
RDW: 15.2 % (ref 11.5–15.5)
WBC: 5 10*3/uL (ref 4.0–10.5)

## 2014-04-26 LAB — LIPID PANEL
Cholesterol: 170 mg/dL (ref 0–200)
HDL: 64 mg/dL (ref >39)
LDL CALC: 94 mg/dL (ref 0–99)
Total CHOL/HDL Ratio: 2.7 Ratio
Triglycerides: 60 mg/dL (ref <150)
VLDL: 12 mg/dL (ref 0–40)

## 2014-04-26 LAB — BASIC METABOLIC PANEL
BUN: 14 mg/dL (ref 6–23)
CALCIUM: 9.5 mg/dL (ref 8.4–10.5)
CO2: 28 mEq/L (ref 19–32)
CREATININE: 0.59 mg/dL (ref 0.50–1.10)
Chloride: 104 mEq/L (ref 96–112)
Glucose, Bld: 97 mg/dL (ref 70–99)
Potassium: 4.6 mEq/L (ref 3.5–5.3)
SODIUM: 140 meq/L (ref 135–145)

## 2014-04-26 LAB — TSH: TSH: 1.436 u[IU]/mL (ref 0.350–4.500)

## 2014-04-26 LAB — HEMOGLOBIN A1C
Hgb A1c MFr Bld: 5.9 % — ABNORMAL HIGH (ref <5.7)
Mean Plasma Glucose: 123 mg/dL — ABNORMAL HIGH (ref <117)

## 2014-04-27 LAB — VITAMIN D 25 HYDROXY (VIT D DEFICIENCY, FRACTURES): Vit D, 25-Hydroxy: 29 ng/mL — ABNORMAL LOW (ref 30–89)

## 2014-05-13 DIAGNOSIS — Z6379 Other stressful life events affecting family and household: Secondary | ICD-10-CM | POA: Insufficient documentation

## 2014-05-13 NOTE — Assessment & Plan Note (Signed)
One daughter has alcohol dependence , is in denial and a source of worry and heartbreak currently, she is working with her trying to change this

## 2014-05-13 NOTE — Assessment & Plan Note (Signed)
Controlled, no change in medication DASH diet and commitment to daily physical activity for a minimum of 30 minutes discussed and encouraged, as a part of hypertension management. The importance of attaining a healthy weight is also discussed.  

## 2014-05-13 NOTE — Progress Notes (Signed)
   Subjective:    Patient ID: Kari Mason, female    DOB: 1966/06/20, 48 y.o.   MRN: [id number]  HPI The PT is here for follow up and re-evaluation of chronic medical conditions, medication management and review of any available recent lab and radiology data.  Preventive health is updated, specifically  Cancer screening and Immunization.   Questions or concerns regarding consultations or procedures which the PT has had in the interim are  addressed. The PT denies any adverse reactions to current medications since the last visit.  There are no new concerns.  New increased family stress, no interest in therapy at this time, no need for medication associated with this     Review of Systems See HPI Denies recent fever or chills. Denies sinus pressure, nasal congestion, ear pain or sore throat. Denies chest congestion, productive cough or wheezing. Denies chest pains, palpitations and leg swelling Denies abdominal pain, nausea, vomiting,diarrhea or constipation.   Denies dysuria, frequency, hesitancy or incontinence. Denies joint pain, swelling and limitation in mobility. Denies headaches, seizures, numbness, or tingling. Denies skin break down or rash.        Objective:   Physical Exam  BP 124/84  Pulse 99  Resp 16  Wt 187 lb 12.8 oz (85.186 kg)  SpO2 98% Patient alert and oriented and in no cardiopulmonary distress.  HEENT: No facial asymmetry, EOMI,   oropharynx pink and moist.  Neck supple no JVD, no mass.  Chest: Clear to auscultation bilaterally.  CVS: S1, S2 no murmurs, no S3.  ABD: Soft non tender.   Ext: No edema  MS: Adequate ROM spine, shoulders, hips and knees.  Skin: Intact, no ulcerations or rash noted.  Psych: Good eye contact, normal affect. Memory intact mildly  anxious at times mildly tearful and  depressed appearing.  CNS: CN 2-12 intact, power,  normal throughout.no focal deficits noted.       Assessment & Plan:  HYPERTENSION Controlled,  no change in medication DASH diet and commitment to daily physical activity for a minimum of 30 minutes discussed and encouraged, as a part of hypertension management. The importance of attaining a healthy weight is also discussed.   OBESITY Improved. Pt applauded on succesful weight loss through lifestyle change, and encouraged to continue same. Weight loss goal set for the next several months.   IGT (impaired glucose tolerance) deteriorated Patient educated about the importance of limiting  Carbohydrate intake , the need to commit to daily physical activity for a minimum of 30 minutes , and to commit weight loss. The fact that changes in all these areas will reduce or eliminate all together the development of diabetes is stressed.     ANEMIA Unchanged and iron is normal  GERD (gastroesophageal reflux disease) Increased symptoms x 2 months with dysphagia trila of PPI and lifestryle initially  Stress due to illness of family member One daughter has alcohol dependence , is in denial and a source of worry and heartbreak currently, she is working with her trying to change this

## 2014-05-13 NOTE — Assessment & Plan Note (Signed)
Unchanged and iron is normal

## 2014-05-13 NOTE — Assessment & Plan Note (Signed)
Improved. Pt applauded on succesful weight loss through lifestyle change, and encouraged to continue same. Weight loss goal set for the next several months.  

## 2014-05-13 NOTE — Assessment & Plan Note (Signed)
deteriorated Patient educated about the importance of limiting  Carbohydrate intake , the need to commit to daily physical activity for a minimum of 30 minutes , and to commit weight loss. The fact that changes in all these areas will reduce or eliminate all together the development of diabetes is stressed.    

## 2014-05-13 NOTE — Assessment & Plan Note (Signed)
Increased symptoms x 2 months with dysphagia trila of PPI and lifestryle initially

## 2014-09-13 ENCOUNTER — Other Ambulatory Visit: Payer: Self-pay | Admitting: Family Medicine

## 2014-09-13 ENCOUNTER — Telehealth: Payer: Self-pay | Admitting: Family Medicine

## 2014-09-14 NOTE — Telephone Encounter (Signed)
Med refilled.

## 2014-09-15 ENCOUNTER — Other Ambulatory Visit: Payer: Self-pay | Admitting: Family Medicine

## 2014-09-16 ENCOUNTER — Telehealth: Payer: Self-pay

## 2014-09-16 NOTE — Telephone Encounter (Signed)
Med refilled 10/9

## 2014-09-17 ENCOUNTER — Other Ambulatory Visit: Payer: Self-pay

## 2014-09-17 MED ORDER — LISINOPRIL-HYDROCHLOROTHIAZIDE 20-12.5 MG PO TABS: ORAL_TABLET | ORAL | Status: DC

## 2014-09-17 NOTE — Telephone Encounter (Signed)
Pt called stating her pharmacy has not received her rx, it is [facility] in [place] on [address]. Pt said she called Friday and yesterday about it. Please advise the RX is for her blood pressure medication

## 2014-09-17 NOTE — Telephone Encounter (Signed)
This is the wrong patient chart

## 2014-09-18 ENCOUNTER — Telehealth: Payer: Self-pay

## 2014-09-19 NOTE — Telephone Encounter (Signed)
Med has been refilled multiple times

## 2014-10-07 ENCOUNTER — Other Ambulatory Visit: Payer: Self-pay | Admitting: Family Medicine

## 2014-10-07 DIAGNOSIS — Z1231 Encounter for screening mammogram for malignant neoplasm of breast: Secondary | ICD-10-CM

## 2014-10-23 ENCOUNTER — Other Ambulatory Visit (HOSPITAL_COMMUNITY)
Admission: RE | Admit: 2014-10-23 | Discharge: 2014-10-23 | Disposition: A | Discharge status: Home / Self Care | Payer: BC Managed Care – PPO | Source: Ambulatory Visit | Attending: Family Medicine | Admitting: Family Medicine

## 2014-10-23 ENCOUNTER — Ambulatory Visit (HOSPITAL_COMMUNITY)
Admission: RE | Admit: 2014-10-23 | Discharge: 2014-10-23 | Disposition: A | Discharge status: Home / Self Care | Payer: BC Managed Care – PPO | Source: Ambulatory Visit | Attending: Family Medicine | Admitting: Family Medicine

## 2014-10-23 ENCOUNTER — Other Ambulatory Visit: Payer: Self-pay | Admitting: Family Medicine

## 2014-10-23 ENCOUNTER — Ambulatory Visit (INDEPENDENT_AMBULATORY_CARE_PROVIDER_SITE_OTHER): Payer: BC Managed Care – PPO | Admitting: Family Medicine

## 2014-10-23 ENCOUNTER — Encounter: Payer: Self-pay | Admitting: Family Medicine

## 2014-10-23 VITALS — BP 120/82 | HR 94 | Resp 16 | Ht 63.0 in | Wt 182.2 lb

## 2014-10-23 DIAGNOSIS — Z01419 Encounter for gynecological examination (general) (routine) without abnormal findings: Secondary | ICD-10-CM | POA: Diagnosis present

## 2014-10-23 DIAGNOSIS — Z13220 Encounter for screening for lipoid disorders: Secondary | ICD-10-CM

## 2014-10-23 DIAGNOSIS — Z139 Encounter for screening, unspecified: Secondary | ICD-10-CM

## 2014-10-23 DIAGNOSIS — H00013 Hordeolum externum right eye, unspecified eyelid: Secondary | ICD-10-CM

## 2014-10-23 DIAGNOSIS — H00019 Hordeolum externum unspecified eye, unspecified eyelid: Secondary | ICD-10-CM | POA: Insufficient documentation

## 2014-10-23 DIAGNOSIS — Z1231 Encounter for screening mammogram for malignant neoplasm of breast: Secondary | ICD-10-CM | POA: Diagnosis present

## 2014-10-23 DIAGNOSIS — Z124 Encounter for screening for malignant neoplasm of cervix: Secondary | ICD-10-CM

## 2014-10-23 DIAGNOSIS — Z0000 Encounter for general adult medical examination without abnormal findings: Secondary | ICD-10-CM

## 2014-10-23 DIAGNOSIS — R7302 Impaired glucose tolerance (oral): Secondary | ICD-10-CM

## 2014-10-23 DIAGNOSIS — I10 Essential (primary) hypertension: Secondary | ICD-10-CM

## 2014-10-23 DIAGNOSIS — Z1211 Encounter for screening for malignant neoplasm of colon: Secondary | ICD-10-CM

## 2014-10-23 LAB — BASIC METABOLIC PANEL
BUN: 12 mg/dL (ref 6–23)
CALCIUM: 9.3 mg/dL (ref 8.4–10.5)
CHLORIDE: 102 meq/L (ref 96–112)
CO2: 25 mEq/L (ref 19–32)
CREATININE: 0.72 mg/dL (ref 0.50–1.10)
Glucose, Bld: 108 mg/dL — ABNORMAL HIGH (ref 70–99)
Potassium: 3.9 mEq/L (ref 3.5–5.3)
Sodium: 136 mEq/L (ref 135–145)

## 2014-10-23 LAB — HEMOGLOBIN A1C
Hgb A1c MFr Bld: 5.8 % — ABNORMAL HIGH (ref <5.7)
MEAN PLASMA GLUCOSE: 120 mg/dL — AB (ref <117)

## 2014-10-23 LAB — POC HEMOCCULT BLD/STL (OFFICE/1-CARD/DIAGNOSTIC): Fecal Occult Blood, POC: NEGATIVE

## 2014-10-23 MED ORDER — CEPHALEXIN 500 MG PO CAPS: 500.0000 mg | ORAL_CAPSULE | Freq: Two times a day (BID) | ORAL | Status: DC

## 2014-10-23 MED ORDER — LISINOPRIL-HYDROCHLOROTHIAZIDE 20-12.5 MG PO TABS: ORAL_TABLET | ORAL | Status: DC

## 2014-10-23 NOTE — Patient Instructions (Addendum)
F/U in May 18 or after pls call if you  Need me before   HBA1C , chem 7 today  Fasting cbc, lipid, chem 7, hBA1C and TSH May 15 or after  It is important that you exercise regularly at least 30 minutes 5 times a week. If you develop chest pain, have severe difficulty breathing, or feel very tired, stop exercising immediately and seek medical attention   A healthy diet is rich in fruit, vegetables and whole grains. Poultry fish, nuts and beans are a healthy choice for protein rather then red meat. A low sodium diet and drinking 64 ounces of water daily is generally recommended. Oils and sweet should be limited. Carbohydrates especially for those who are diabetic or overweight, should be limited to 60-45 gram per meal. It is important to eat on a regular schedule, at least 3 times daily. Snacks should be primarily fruits, vegetables or nuts.   Antibiotic prescribed for stye under right eye

## 2014-10-27 DIAGNOSIS — Z1211 Encounter for screening for malignant neoplasm of colon: Secondary | ICD-10-CM | POA: Insufficient documentation

## 2014-10-27 NOTE — Assessment & Plan Note (Signed)
Antibiotic prescribed due to mild inflammation and tenderness Educated pt re need to avoid trauma to the area

## 2014-10-27 NOTE — Assessment & Plan Note (Signed)

## 2014-10-27 NOTE — Progress Notes (Signed)
   Subjective:    Patient ID: Kari Mason, female    DOB: 1966/05/27, 48 y.o.   MRN: [id number]  HPI Patient is in for pelvic and breast exam. C/o painful stye under right eye for the past 4 days, denies drainage from the area    Review of Systems See HPI     Objective:   Physical Exam  BP 120/82 mmHg  Pulse 94  Resp 16  Ht 5\' 3"  (1.6 m)  Wt 182 lb 3.2 oz (82.645 kg)  BMI 32.28 kg/m2  SpO2 99%  LMP 09/22/2014   Pleasant well nourished female, alert and oriented x 3, in no cardio-pulmonary distress. Afebrile. HEENT No facial trauma or asymetry. Sinuses non tender.  Extra occullar muscles intact, pupils equally reactive to light. External ears normal, tympanic membranes clear. Oropharynx moist, no exudate, good dentition. Neck: supple, no adenopathy,JVD or thyromegaly.No bruits.  Chest: Clear to ascultation bilaterally.No crackles or wheezes. Non tender to palpation  Breast: No asymetry,no masses or lumps. No tenderness. No nipple discharge or inversion. No axillary or supraclavicular adenopathy  Cardiovascular system; Heart sounds normal,  S1 and  S2 ,no S3.  No murmur, or thrill. Apical beat not displaced Peripheral pulses normal.  Abdomen: Soft, non tender, no organomegaly or masses. No bruits. Bowel sounds normal. No guarding, tenderness or rebound.  Rectal:  Normal sphincter tone. No mass.No rectal masses.  Guaiac negative stool.  GU: External genitalia normal female genitalia , female distribution of hair. No lesions. Urethral meatus normal in size, no  Prolapse, no lesions visibly  Present. Bladder non tender. Vagina pink and moist , with no visible lesions , discharge present . Adequate pelvic support no  cystocele or rectocele noted Cervix pink and appears healthy, no lesions or ulcerations noted, no discharge noted from os Uterus normal size, no adnexal masses, no cervical motion or adnexal tenderness.   Musculoskeletal exam: Full ROM of  spine, hips , shoulders and knees. No deformity ,swelling or crepitus noted. No muscle wasting or atrophy.   Neurologic: Cranial nerves 2 to 12 intact. Power, tone ,sensation and reflexes normal throughout. No disturbance in gait. No tremor.  Skin: Intact, erythematous tender sty under right lower lid Pigmentation normal throughout  Psych; Normal mood and affect. Judgement and concentration normal       Assessment & Plan:  Annual physical exam Annual exam as documented. Counseling done  re healthy lifestyle involving commitment to 150 minutes exercise per week, heart healthy diet, and attaining healthy weight.The importance of adequate sleep also discussed. Regular seat belt use and home safety, is also discussed. Changes in health habits are decided on by the patient with goals and time frames  set for achieving them. Immunization and cancer screening needs are specifically addressed at this visit.   Stye Antibiotic prescribed due to mild inflammation and tenderness Educated pt re need to avoid trauma to the area  Special screening for malignant neoplasms, colon No palpable mass on exam, an heme negative stool

## 2014-10-27 NOTE — Assessment & Plan Note (Signed)
No palpable mass on exam, an heme negative stool

## 2014-10-28 LAB — CYTOLOGY - PAP

## 2015-04-28 ENCOUNTER — Ambulatory Visit: Payer: BLUE CROSS/BLUE SHIELD | Admitting: Family Medicine

## 2015-05-07 ENCOUNTER — Ambulatory Visit: Payer: BLUE CROSS/BLUE SHIELD | Admitting: Family Medicine

## 2015-06-11 ENCOUNTER — Ambulatory Visit: Payer: BLUE CROSS/BLUE SHIELD | Admitting: Family Medicine

## 2015-06-17 ENCOUNTER — Ambulatory Visit: Payer: BLUE CROSS/BLUE SHIELD | Admitting: Family Medicine

## 2015-07-14 ENCOUNTER — Ambulatory Visit (INDEPENDENT_AMBULATORY_CARE_PROVIDER_SITE_OTHER): Payer: BLUE CROSS/BLUE SHIELD | Admitting: Family Medicine

## 2015-07-14 ENCOUNTER — Encounter (INDEPENDENT_AMBULATORY_CARE_PROVIDER_SITE_OTHER): Payer: Self-pay | Admitting: *Deleted

## 2015-07-14 ENCOUNTER — Encounter: Payer: Self-pay | Admitting: Family Medicine

## 2015-07-14 VITALS — BP 120/82 | HR 90 | Resp 18 | Ht 63.0 in | Wt 188.0 lb

## 2015-07-14 DIAGNOSIS — I10 Essential (primary) hypertension: Secondary | ICD-10-CM

## 2015-07-14 DIAGNOSIS — Z13220 Encounter for screening for lipoid disorders: Secondary | ICD-10-CM

## 2015-07-14 DIAGNOSIS — E669 Obesity, unspecified: Secondary | ICD-10-CM

## 2015-07-14 DIAGNOSIS — R7302 Impaired glucose tolerance (oral): Secondary | ICD-10-CM | POA: Diagnosis not present

## 2015-07-14 DIAGNOSIS — K219 Gastro-esophageal reflux disease without esophagitis: Secondary | ICD-10-CM

## 2015-07-14 DIAGNOSIS — Z114 Encounter for screening for human immunodeficiency virus [HIV]: Secondary | ICD-10-CM | POA: Diagnosis not present

## 2015-07-14 DIAGNOSIS — Z1231 Encounter for screening mammogram for malignant neoplasm of breast: Secondary | ICD-10-CM

## 2015-07-14 DIAGNOSIS — Z1211 Encounter for screening for malignant neoplasm of colon: Secondary | ICD-10-CM

## 2015-07-14 DIAGNOSIS — J3089 Other allergic rhinitis: Secondary | ICD-10-CM

## 2015-07-14 MED ORDER — RANITIDINE HCL 150 MG PO CAPS: 150.0000 mg | ORAL_CAPSULE | Freq: Two times a day (BID) | ORAL | Status: DC

## 2015-07-14 MED ORDER — CETIRIZINE HCL 10 MG PO TABS: 10.0000 mg | ORAL_TABLET | Freq: Every day | ORAL | Status: DC

## 2015-07-14 MED ORDER — LISINOPRIL-HYDROCHLOROTHIAZIDE 20-12.5 MG PO TABS: ORAL_TABLET | ORAL | Status: DC

## 2015-07-14 NOTE — Assessment & Plan Note (Signed)
Increased symptoms on intermittent basis, will send zantac for as needed use Avoidance of caffeine is  stressed

## 2015-07-14 NOTE — Progress Notes (Signed)
Kari Mason     MRN: [id number]      DOB: 23-Feb-1966   HPI [name] is here for follow up and re-evaluation of chronic medical conditions, medication management and review of any available recent lab and radiology data.  Preventive health is updated, specifically  Cancer screening and Immunization.   Questions or concerns regarding consultations or procedures which the PT has had in the interim are  addressed. The PT denies any adverse reactions to current medications since the last visit.  Increased reflux symptoms No regular exercise, eating out often with weight gain, will work on this ROS Denies recent fever or chills. Denies sinus pressure, nasal congestion, ear pain or sore throat. Denies chest congestion, productive cough or wheezing. Denies chest pains, palpitations and leg swelling C/o intermittent  abdominal pain, reflux acting up, denies dysphagia, denies nausea, vomiting,diarrhea or constipation.   Denies dysuria, frequency, hesitancy or incontinence. Denies joint pain, swelling and limitation in mobility. Denies headaches, seizures, numbness, or tingling. Denies depression, anxiety or insomnia. Denies skin break down or rash.   PE  BP 120/82 mmHg  Pulse 90  Resp 18  Ht 5\' 3"  (1.6 m)  Wt 188 lb 0.6 oz (85.294 kg)  BMI 33.32 kg/m2  SpO2 98%  Patient alert and oriented and in no cardiopulmonary distress.  HEENT: No facial asymmetry, EOMI,   oropharynx pink and moist.  Neck supple no JVD, no mass.  Chest: Clear to auscultation bilaterally.  CVS: S1, S2 no murmurs, no S3.Regular rate.  ABD: Soft non tender.   Ext: No edema  MS: Adequate ROM spine, shoulders, hips and knees.  Skin: Intact, no ulcerations or rash noted.  Psych: Good eye contact, normal affect. Memory intact not anxious or depressed appearing.  CNS: CN 2-12 intact, power,  normal throughout.no focal deficits noted.   Assessment & Plan   Essential hypertension Controlled, no change  in medication DASH diet and commitment to daily physical activity for a minimum of 30 minutes discussed and encouraged, as a part of hypertension management. The importance of attaining a healthy weight is also discussed.  BP/Weight 07/14/2015 10/23/2014 04/23/2014 10/22/2013 04/18/2013 94/17/4081 03/09/8184  Systolic BP 631 497 026 378 588 502 774  Diastolic BP 82 82 84 84 80 80 90  Wt. (Lbs) 188.04 182.2 187.8 191.4 191 185.08 188  BMI 33.32 32.28 33.28 33.91 33.84 32.79 33.31        Allergic rhinitis Controlled, no change in medication Uses medication sporadically only  IGT (impaired glucose tolerance)  Updated lab needed at/ before next visit. Patient educated about the importance of limiting  Carbohydrate intake , the need to commit to daily physical activity for a minimum of 30 minutes , and to commit weight loss. The fact that changes in all these areas will reduce or eliminate all together the development of diabetes is stressed.   Diabetic Labs Latest Ref Rng 10/23/2014 04/26/2014 04/18/2013 04/11/2012 06/02/2011  HbA1c <5.7 % 5.8(H) 5.9(H) 5.6 5.7(H) -  Chol 0 - 200 mg/dL - 170 - 160 -  HDL >39 mg/dL - 64 - 55 -  Calc LDL 0 - 99 mg/dL - 94 - 96 -  Triglycerides <150 mg/dL - 60 - 45 -  Creatinine 0.50 - 1.10 mg/dL 0.72 0.59 0.64 0.63 0.60   BP/Weight 07/14/2015 10/23/2014 04/23/2014 10/22/2013 04/18/2013 12/87/8676 06/06/946  Systolic BP 096 283 662 947 654 650 354  Diastolic BP 82 82 84 84 80 80 90  Wt. (Lbs) 188.04  182.2 187.8 191.4 191 185.08 188  BMI 33.32 32.28 33.28 33.91 33.84 32.79 33.31   No flowsheet data found.         Annual physical exam    GERD (gastroesophageal reflux disease) Increased symptoms on intermittent basis, will send zantac for as needed use Avoidance of caffeine is  stressed  Obesity Deteriorated. Patient re-educated about  the importance of commitment to a  minimum of 150 minutes of exercise per week.  The importance of healthy food  choices with portion control discussed. Encouraged to start a food diary, count calories and to consider  joining a support group. Sample diet sheets offered. Goals set by the patient for the next several months.   Weight /BMI 07/14/2015 10/23/2014 04/23/2014  WEIGHT 188 lb 0.6 oz 182 lb 3.2 oz 187 lb 12.8 oz  HEIGHT 5\' 3"  5\' 3"  -  BMI 33.32 kg/m2 32.28 kg/m2 33.28 kg/m2    Current exercise per week 60 minutes.

## 2015-07-14 NOTE — Assessment & Plan Note (Addendum)
  Updated lab needed at/ before next visit. Patient educated about the importance of limiting  Carbohydrate intake , the need to commit to daily physical activity for a minimum of 30 minutes , and to commit weight loss. The fact that changes in all these areas will reduce or eliminate all together the development of diabetes is stressed.   Diabetic Labs Latest Ref Rng 10/23/2014 04/26/2014 04/18/2013 04/11/2012 06/02/2011  HbA1c <5.7 % 5.8(H) 5.9(H) 5.6 5.7(H) -  Chol 0 - 200 mg/dL - 170 - 160 -  HDL >39 mg/dL - 64 - 55 -  Calc LDL 0 - 99 mg/dL - 94 - 96 -  Triglycerides <150 mg/dL - 60 - 45 -  Creatinine 0.50 - 1.10 mg/dL 0.72 0.59 0.64 0.63 0.60   BP/Weight 07/14/2015 10/23/2014 04/23/2014 10/22/2013 04/18/2013 77/41/4239 04/07/2022  Systolic BP 343 568 616 837 290 211 155  Diastolic BP 82 82 84 84 80 80 90  Wt. (Lbs) 188.04 182.2 187.8 191.4 191 185.08 188  BMI 33.32 32.28 33.28 33.91 33.84 32.79 33.31   No flowsheet data found.

## 2015-07-14 NOTE — Patient Instructions (Addendum)
F/u in 5 months, call if you need me before  Please schedule your mammogram due after Nov 16  You are referred for colonoscopy with Dr Laural Golden his office will mail you a letter to schedule   Fasting lipid, cmp, HBa1C, HIV, tSH and CBC in am,    It is important that you exercise regularly at least 30 minutes 5 times a week. If you develop chest pain, have severe difficulty breathing, or feel very tired, stop exercising immediately and seek medical attention   Please commit to daily exercise for 30 minutes  Weight loss goal of 6 pounds  Reduce portion size of protein and carbs, eat mainly  Fruit and vegetable , fresh or frozen  New for reflux is zantac  Please work on good  health habits so that your health will improve. 1. Commitment to daily physical activity for 30 to 60  minutes, if you are able to do this.  2. Commitment to wise food choices. Aim for half of your  food intake to be vegetable and fruit, one quarter starchy foods, and one quarter protein. Try to eat on a regular schedule  3 meals per day, snacking between meals should be limited to vegetables or fruits or small portions of nuts. 64 ounces of water per day is generally recommended, unless you have specific health conditions, like heart failure or kidney failure where you will need to limit fluid intake.  3. Commitment to sufficient and a  good quality of physical and mental rest daily, generally between 6 to 8 hours per day.  WITH PERSISTANCE AND PERSEVERANCE, THE IMPOSSIBLE , BECOMES THE NORM!  Thanks for choosing [facility], we consider it a privelige to serve you.

## 2015-07-14 NOTE — Assessment & Plan Note (Signed)
Controlled, no change in medication DASH diet and commitment to daily physical activity for a minimum of 30 minutes discussed and encouraged, as a part of hypertension management. The importance of attaining a healthy weight is also discussed.  BP/Weight 07/14/2015 10/23/2014 04/23/2014 10/22/2013 04/18/2013 96/10/6434 02/12/1224  Systolic BP 834 621 947 125 271 292 909  Diastolic BP 82 82 84 84 80 80 90  Wt. (Lbs) 188.04 182.2 187.8 191.4 191 185.08 188  BMI 33.32 32.28 33.28 33.91 33.84 32.79 33.31

## 2015-07-14 NOTE — Assessment & Plan Note (Signed)
Controlled, no change in medication Uses medication sporadically only

## 2015-07-14 NOTE — Addendum Note (Signed)
Addended by: Denman George B on: 07/14/2015 04:10 PM   Modules accepted: Orders

## 2015-07-14 NOTE — Assessment & Plan Note (Deleted)
Heme negative stool, no mass 

## 2015-07-14 NOTE — Assessment & Plan Note (Signed)
Deteriorated. Patient re-educated about  the importance of commitment to a  minimum of 150 minutes of exercise per week.  The importance of healthy food choices with portion control discussed. Encouraged to start a food diary, count calories and to consider  joining a support group. Sample diet sheets offered. Goals set by the patient for the next several months.   Weight /BMI 07/14/2015 10/23/2014 04/23/2014  WEIGHT 188 lb 0.6 oz 182 lb 3.2 oz 187 lb 12.8 oz  HEIGHT 5\' 3"  5\' 3"  -  BMI 33.32 kg/m2 32.28 kg/m2 33.28 kg/m2    Current exercise per week 60 minutes.

## 2015-08-07 ENCOUNTER — Other Ambulatory Visit (INDEPENDENT_AMBULATORY_CARE_PROVIDER_SITE_OTHER): Payer: Self-pay | Admitting: *Deleted

## 2015-08-07 DIAGNOSIS — Z1211 Encounter for screening for malignant neoplasm of colon: Secondary | ICD-10-CM

## 2015-09-09 ENCOUNTER — Telehealth (INDEPENDENT_AMBULATORY_CARE_PROVIDER_SITE_OTHER): Payer: Self-pay | Admitting: *Deleted

## 2015-09-09 DIAGNOSIS — Z1211 Encounter for screening for malignant neoplasm of colon: Secondary | ICD-10-CM

## 2015-09-09 NOTE — Telephone Encounter (Signed)
Patient needs trilyte 

## 2015-09-12 MED ORDER — PEG 3350-KCL-NA BICARB-NACL 420 G PO SOLR: 4000.0000 mL | Freq: Once | ORAL | Status: DC

## 2015-09-17 ENCOUNTER — Telehealth (INDEPENDENT_AMBULATORY_CARE_PROVIDER_SITE_OTHER): Payer: Self-pay | Admitting: *Deleted

## 2015-09-17 NOTE — Telephone Encounter (Signed)
Referring MD/PCP: simpson   Procedure: tcs  Reason/Indication:  screening  Has patient had this procedure before?  no  If so, when, by whom and where?    Is there a family history of colon cancer?  no  Who?  What age when diagnosed?    Is patient diabetic?   no      Does patient have prosthetic heart valve?  no  Do you have a pacemaker?  no  Has patient ever had endocarditis? no  Has patient had joint replacement within last 12 months?  no  Does patient tend to be constipated or take laxatives? no  Does patient have a history of alcohol/drug use? no  Is patient on Coumadin, Plavix and/or Aspirin? no  Medications: see epic  Allergies: nkda  Medication Adjustment:   Procedure date & time: 10/15/15 at 1200

## 2015-09-18 NOTE — Telephone Encounter (Signed)
agree

## 2015-10-15 ENCOUNTER — Ambulatory Visit (HOSPITAL_COMMUNITY)
Admission: RE | Admit: 2015-10-15 | Discharge: 2015-10-15 | Disposition: A | Discharge status: Home / Self Care | Payer: BLUE CROSS/BLUE SHIELD | Source: Ambulatory Visit | Attending: Internal Medicine | Admitting: Internal Medicine

## 2015-10-15 ENCOUNTER — Encounter (HOSPITAL_COMMUNITY): Payer: Self-pay | Admitting: *Deleted

## 2015-10-15 ENCOUNTER — Encounter (HOSPITAL_COMMUNITY)
Admission: RE | Disposition: A | Discharge status: Home / Self Care | Payer: Self-pay | Source: Ambulatory Visit | Attending: Internal Medicine

## 2015-10-15 DIAGNOSIS — I10 Essential (primary) hypertension: Secondary | ICD-10-CM | POA: Diagnosis not present

## 2015-10-15 DIAGNOSIS — K219 Gastro-esophageal reflux disease without esophagitis: Secondary | ICD-10-CM | POA: Insufficient documentation

## 2015-10-15 DIAGNOSIS — Z1211 Encounter for screening for malignant neoplasm of colon: Secondary | ICD-10-CM

## 2015-10-15 DIAGNOSIS — Z79899 Other long term (current) drug therapy: Secondary | ICD-10-CM | POA: Diagnosis not present

## 2015-10-15 DIAGNOSIS — D125 Benign neoplasm of sigmoid colon: Secondary | ICD-10-CM | POA: Insufficient documentation

## 2015-10-15 DIAGNOSIS — K644 Residual hemorrhoidal skin tags: Secondary | ICD-10-CM | POA: Insufficient documentation

## 2015-10-15 DIAGNOSIS — D120 Benign neoplasm of cecum: Secondary | ICD-10-CM | POA: Diagnosis not present

## 2015-10-15 HISTORY — DX: Gastro-esophageal reflux disease without esophagitis: K21.9

## 2015-10-15 HISTORY — PX: COLONOSCOPY: SHX5424

## 2015-10-15 SURGERY — COLONOSCOPY
Anesthesia: Moderate Sedation

## 2015-10-15 MED ORDER — MEPERIDINE HCL 50 MG/ML IJ SOLN
INTRAMUSCULAR | Status: DC | PRN
  Administered 2015-10-15 (×2): 25 mg via INTRAVENOUS

## 2015-10-15 MED ORDER — MIDAZOLAM HCL 5 MG/5ML IJ SOLN
INTRAMUSCULAR | Status: DC | PRN
  Administered 2015-10-15 (×2): 2 mg via INTRAVENOUS
  Administered 2015-10-15: 1 mg via INTRAVENOUS
  Administered 2015-10-15: 2 mg via INTRAVENOUS

## 2015-10-15 MED ORDER — SODIUM CHLORIDE 0.9 % IV SOLN
INTRAVENOUS | Status: DC
  Administered 2015-10-15: 11:00:00 via INTRAVENOUS

## 2015-10-15 MED ORDER — MEPERIDINE HCL 50 MG/ML IJ SOLN
INTRAMUSCULAR | Status: AC
  Filled 2015-10-15: qty 1

## 2015-10-15 MED ORDER — SIMETHICONE 40 MG/0.6ML PO SUSP
ORAL | Status: DC | PRN
  Administered 2015-10-15: 11:00:00

## 2015-10-15 MED ORDER — MIDAZOLAM HCL 5 MG/5ML IJ SOLN
INTRAMUSCULAR | Status: AC
  Filled 2015-10-15: qty 10

## 2015-10-15 NOTE — Op Note (Signed)
COLONOSCOPY PROCEDURE REPORT  PATIENT:  Kari Mason  MR#:  [id number] Birthdate:  1966/02/13, 49 y.o., female Endoscopist:  Dr. Rogene Houston, MD Referred By:  Dr. Tula Nakayama, MD  Procedure Date: 10/15/2015  Procedure:   Colonoscopy with snare polypectomy and clip application to polypectomy site.  Indications: Patient is 49 year old African American female was undergoing average risk screening colonoscopy.  Informed Consent:  The procedure and risks were reviewed with the patient and informed consent was obtained.  Medications:  Demerol 50 mg IV Versed 7 mg IV  Description of procedure:  After a digital rectal exam was performed, that colonoscope was advanced from the anus through the rectum and colon to the area of the cecum, ileocecal valve and appendiceal orifice. The cecum was deeply intubated. These structures were well-seen and photographed for the record. From the level of the cecum and ileocecal valve, the scope was slowly and cautiously withdrawn. The mucosal surfaces were carefully surveyed utilizing scope tip to flexion to facilitate fold flattening as needed. The scope was pulled down into the rectum where a thorough exam including retroflexion was performed.  Findings:   Prep excellent. Small polyp cold snared from cecum. 20 mm polyp on a short stalk was hot snare from mid sigmoid colon. Oozing noted from polypectomy site. Two 360 clips applied for hemostasis. Normal rectal mucosa. Small hemorrhoids below the dentate line.   Therapeutic/Diagnostic Maneuvers Performed:  See above  Complications:  None  EBL:  Minimal  Cecal Withdrawal Time:  20  minutes  Impression:  Examination performed to cecum. Small cecal polyp was cold snared. 20 mm sigmoid colon hot snared. Two 360 clips to polypectomy site for hemostasis. External hemorrhoids  Recommendations:  Standard instructions given. No aspirin or NSAIDs for 10 days. I will contact patient with biopsy  results and further recommendations.  REHMAN,NAJEEB U  10/15/2015 12:10 PM  CC: Dr. Tula Nakayama, MD & Dr. Rayne Du ref. provider found

## 2015-10-15 NOTE — Discharge Instructions (Signed)
No aspirin or NSAIDs for 10 days. Resume usual medications and diet. Physician will call with biopsy results.Colon Polyps Polyps are lumps of extra tissue growing inside the body. Polyps can grow in the large intestine (colon). Most colon polyps are noncancerous (benign). However, some colon polyps can become cancerous over time. Polyps that are larger than a pea may be harmful. To be safe, caregivers remove and test all polyps. CAUSES  Polyps form when mutations in the genes cause your cells to grow and divide even though no more tissue is needed. RISK FACTORS There are a number of risk factors that can increase your chances of getting colon polyps. They include:  Being older than 50 years.  Family history of colon polyps or colon cancer.  Long-term colon diseases, such as colitis or Crohn disease.  Being overweight.  Smoking.  Being inactive.  Drinking too much alcohol. SYMPTOMS  Most small polyps do not cause symptoms. If symptoms are present, they may include:  Blood in the stool. The stool may look dark red or black.  Constipation or diarrhea that lasts longer than 1 week. DIAGNOSIS People often do not know they have polyps until their caregiver finds them during a regular checkup. Your caregiver can use 4 tests to check for polyps:  Digital rectal exam. The caregiver wears gloves and feels inside the rectum. This test would find polyps only in the rectum.  Barium enema. The caregiver puts a liquid called barium into your rectum before taking X-rays of your colon. Barium makes your colon look white. Polyps are dark, so they are easy to see in the X-ray pictures.  Sigmoidoscopy. A thin, flexible tube (sigmoidoscope) is placed into your rectum. The sigmoidoscope has a light and tiny camera in it. The caregiver uses the sigmoidoscope to look at the last third of your colon.  Colonoscopy. This test is like sigmoidoscopy, but the caregiver looks at the entire colon. This is the  most common method for finding and removing polyps. TREATMENT  Any polyps will be removed during a sigmoidoscopy or colonoscopy. The polyps are then tested for cancer. PREVENTION  To help lower your risk of getting more colon polyps:  Eat plenty of fruits and vegetables. Avoid eating fatty foods.  Do not smoke.  Avoid drinking alcohol.  Exercise every day.  Lose weight if recommended by your caregiver.  Eat plenty of calcium and folate. Foods that are rich in calcium include milk, cheese, and broccoli. Foods that are rich in folate include chickpeas, kidney beans, and spinach. HOME CARE INSTRUCTIONS Keep all follow-up appointments as directed by your caregiver. You may need periodic exams to check for polyps. SEEK MEDICAL CARE IF: You notice bleeding during a bowel movement.   This information is not intended to replace advice given to you by your health care provider. Make sure you discuss any questions you have with your health care provider.   Document Released: 08/18/2004 Document Revised: 12/13/2014 Document Reviewed: 02/01/2012 Elsevier Interactive Patient Education 2016 [employer]. Colonoscopy, Care After These instructions give you information on caring for yourself after your procedure. Your doctor may also give you more specific instructions. Call your doctor if you have any problems or questions after your procedure. HOME CARE  Do not drive for 24 hours.  Do not sign important papers or use machinery for 24 hours.  You may shower.  You may go back to your usual activities, but go slower for the first 24 hours.  Take rest breaks often during  the first 24 hours.  Walk around or use warm packs on your belly (abdomen) if you have belly cramping or gas.  Drink enough fluids to keep your pee (urine) clear or pale yellow.  Resume your normal diet. Avoid heavy or fried foods.  Avoid drinking alcohol for 24 hours or as told by your doctor.  Only take medicines as  told by your doctor. If a tissue sample (biopsy) was taken during the procedure:   Do not take aspirin or blood thinners for 7 days, or as told by your doctor.  Do not drink alcohol for 7 days, or as told by your doctor.  Eat soft foods for the first 24 hours. GET HELP IF: You still have a small amount of blood in your poop (stool) 2-3 days after the procedure. GET HELP RIGHT AWAY IF:  You have more than a small amount of blood in your poop.  You see clumps of tissue (blood clots) in your poop.  Your belly is puffy (swollen).  You feel sick to your stomach (nauseous) or throw up (vomit).  You have a fever.  You have belly pain that gets worse and medicine does not help. MAKE SURE YOU:  Understand these instructions.  Will watch your condition.  Will get help right away if you are not doing well or get worse.   This information is not intended to replace advice given to you by your health care provider. Make sure you discuss any questions you have with your health care provider.   Document Released: 12/25/2010 Document Revised: 11/27/2013 Document Reviewed: 07/30/2013 Elsevier Interactive Patient Education [employer].

## 2015-10-15 NOTE — H&P (Signed)
Kari Mason is an 49 y.o. female.   Chief Complaint: Patient is here for colonoscopy. HPI: Patient is 49 year old African-American female who is here for screening colonoscopy. She denies abdominal pain change in bowel habits or rectal bleeding. Family history is negative for CRC.  Past Medical History  Diagnosis Date  . Hypertension   . Allergy   . GERD (gastroesophageal reflux disease)     Past Surgical History  Procedure Laterality Date  . Tubal ligation      History reviewed. No pertinent family history. Social History:  reports that she has never smoked. She does not have any smokeless tobacco history on file. She reports that she does not drink alcohol or use illicit drugs.  Allergies: No Known Allergies  Medications Prior to Admission  Medication Sig Dispense Refill  . lisinopril-hydrochlorothiazide (PRINZIDE,ZESTORETIC) 20-12.5 MG per tablet TAKE ONE & ONE-HALF TABLETS BY MOUTH ONCE DAILY 135 tablet 1  . polyethylene glycol-electrolytes (NULYTELY/GOLYTELY) 420 G solution Take 4,000 mLs by mouth once. 4000 mL 0  . cetirizine (ZYRTEC) 10 MG tablet Take 1 tablet (10 mg total) by mouth daily. Take 10 mg by mouth daily. (Patient not taking: Reported on 10/07/2015) 90 tablet 0  . ranitidine (ZANTAC) 150 MG capsule Take 1 capsule (150 mg total) by mouth 2 (two) times daily. 180 capsule 0    No results found for this or any previous visit (from the past 48 hour(s)). No results found.  ROS  Blood pressure 136/81, pulse 89, temperature 98.2 F (36.8 C), temperature source Oral, resp. rate 19, height 5\' 3"  (1.6 m), weight 188 lb (85.276 kg), last menstrual period 09/22/2015, SpO2 100 %. Physical Exam  Constitutional: She appears well-developed and well-nourished.  HENT:  Mouth/Throat: Oropharynx is clear and moist.  Eyes: Conjunctivae are normal. No scleral icterus.  Neck: No thyromegaly present.  Cardiovascular: Normal rate, regular rhythm and normal heart sounds.   No  murmur heard. Respiratory: Effort normal and breath sounds normal.  GI: Soft. She exhibits no distension and no mass. There is no tenderness.  Musculoskeletal: She exhibits no edema.  Lymphadenopathy:    She has no cervical adenopathy.  Neurological: She is alert.  Skin: Skin is warm and dry.     Assessment/Plan Average risk screening colonoscopy.  Jerame Hedding U 10/15/2015, 11:24 AM

## 2015-10-17 ENCOUNTER — Encounter: Payer: Self-pay | Admitting: Family Medicine

## 2015-10-17 DIAGNOSIS — D126 Benign neoplasm of colon, unspecified: Secondary | ICD-10-CM | POA: Insufficient documentation

## 2015-10-20 ENCOUNTER — Encounter (HOSPITAL_COMMUNITY): Payer: Self-pay | Admitting: Internal Medicine

## 2015-10-21 ENCOUNTER — Encounter: Payer: BLUE CROSS/BLUE SHIELD | Admitting: Family Medicine

## 2015-10-21 ENCOUNTER — Other Ambulatory Visit: Payer: Self-pay

## 2015-10-21 DIAGNOSIS — Z1231 Encounter for screening mammogram for malignant neoplasm of breast: Secondary | ICD-10-CM

## 2015-12-02 ENCOUNTER — Encounter: Payer: Self-pay | Admitting: Family Medicine

## 2015-12-02 ENCOUNTER — Ambulatory Visit (INDEPENDENT_AMBULATORY_CARE_PROVIDER_SITE_OTHER): Payer: BLUE CROSS/BLUE SHIELD | Admitting: Family Medicine

## 2015-12-02 VITALS — BP 120/82 | HR 117 | Temp 98.8°F | Resp 16 | Ht 63.0 in | Wt 193.0 lb

## 2015-12-02 DIAGNOSIS — E669 Obesity, unspecified: Secondary | ICD-10-CM | POA: Diagnosis not present

## 2015-12-02 DIAGNOSIS — R05 Cough: Secondary | ICD-10-CM

## 2015-12-02 DIAGNOSIS — J0110 Acute frontal sinusitis, unspecified: Secondary | ICD-10-CM | POA: Diagnosis not present

## 2015-12-02 DIAGNOSIS — I10 Essential (primary) hypertension: Secondary | ICD-10-CM | POA: Diagnosis not present

## 2015-12-02 DIAGNOSIS — R058 Other specified cough: Secondary | ICD-10-CM | POA: Insufficient documentation

## 2015-12-02 MED ORDER — PREDNISONE 5 MG PO TABS: 5.0000 mg | ORAL_TABLET | Freq: Two times a day (BID) | ORAL | Status: AC

## 2015-12-02 MED ORDER — PROMETHAZINE HCL 12.5 MG PO TABS: ORAL_TABLET | ORAL | Status: DC

## 2015-12-02 MED ORDER — AZITHROMYCIN 250 MG PO TABS: ORAL_TABLET | ORAL | Status: DC

## 2015-12-02 MED ORDER — DEXTROMETHORPHAN-GUAIFENESIN 20-400 MG PO TABS: 1.0000 | ORAL_TABLET | Freq: Two times a day (BID) | ORAL | Status: DC

## 2015-12-02 NOTE — Assessment & Plan Note (Signed)
Deteriorated. Patient re-educated about  the importance of commitment to a  minimum of 150 minutes of exercise per week.  The importance of healthy food choices with portion control discussed. Encouraged to start a food diary, count calories and to consider  joining a support group. Sample diet sheets offered. Goals set by the patient for the next several months.   Weight /BMI 12/02/2015 10/15/2015 07/14/2015  WEIGHT 193 lb 188 lb 188 lb 0.6 oz  HEIGHT 5\' 3"  5\' 3"  5\' 3"   BMI 34.2 kg/m2 33.31 kg/m2 33.32 kg/m2    Current exercise per week 60 minutes.

## 2015-12-02 NOTE — Assessment & Plan Note (Signed)
Increased and uncontrolled with acute illness, suppressant prescribed

## 2015-12-02 NOTE — Assessment & Plan Note (Signed)
Controlled, no change in medication DASH diet and commitment to daily physical activity for a minimum of 30 minutes discussed and encouraged, as a part of hypertension management. The importance of attaining a healthy weight is also discussed.  BP/Weight 12/02/2015 10/15/2015 07/14/2015 10/23/2014 04/23/2014 10/22/2013 123456  Systolic BP 123456 [id number] 123456 [id number]  Diastolic BP 82 88 82 82 84 84 80  Wt. (Lbs) 193 188 188.04 182.2 187.8 191.4 191  BMI 34.2 33.31 33.32 32.28 33.28 33.91 33.84

## 2015-12-02 NOTE — Patient Instructions (Signed)
Keep CPE appt onJan 19, cancel the 5th  You are treated for acute frontal sinusitis  Two tylenol tabs in office for headache  Azithromycin and prednisone are prescribed , also phenergan tablets. Get oTC dextromethorphan to help with cough an congestion  Use tylenol for pain relief , up to four 500 mg tabs per day , if needed  Sinusitis, Adult Sinusitis is redness, soreness, and puffiness (inflammation) of the air pockets in the bones of your face (sinuses). The redness, soreness, and puffiness can cause air and mucus to get trapped in your sinuses. This can allow germs to grow and cause an infection.  HOME CARE   Drink enough fluids to keep your pee (urine) clear or pale yellow.  Use a humidifier in your home.  Run a hot shower to create steam in the bathroom. Sit in the bathroom with the door closed. Breathe in the steam 3-4 times a day.  Put a warm, moist washcloth on your face 3-4 times a day, or as told by your doctor.  Use salt water sprays (saline sprays) to wet the thick fluid in your nose. This can help the sinuses drain.  Only take medicine as told by your doctor. GET HELP RIGHT AWAY IF:   Your pain gets worse.  You have very bad headaches.  You are sick to your stomach (nauseous).  You throw up (vomit).  You are very sleepy (drowsy) all the time.  Your face is puffy (swollen).  Your vision changes.  You have a stiff neck.  You have trouble breathing. MAKE SURE YOU:   Understand these instructions.  Will watch your condition.  Will get help right away if you are not doing well or get worse.   This information is not intended to replace advice given to you by your health care provider. Make sure you discuss any questions you have with your health care provider.   Document Released: 05/10/2008 Document Revised: 12/13/2014 Document Reviewed: 06/27/2012 Elsevier Interactive Patient Education [employer].

## 2015-12-02 NOTE — Assessment & Plan Note (Signed)
z pack o prescribed, also cough suppressant and short course of prednisone

## 2015-12-02 NOTE — Progress Notes (Signed)
   Subjective:    Patient ID: Kari Mason, female    DOB: 10/19/1966, 49 y.o.   MRN: [id number]  HPI 3 day h/o frontal pressure and excessive cough with body aches and chills. No sputum, sinus drainage is thick and discolored Poor appetitie and feels week Pounding frontal headache and chest pain with cough   Review of Systems See HPI . Denies  palpitations and leg swelling Denies abdominal pain, nausea, vomiting,diarrhea or constipation.   Denies dysuria, frequency, hesitancy or incontinence. Denies joint pain, swelling and limitation in mobility. Denies headaches, seizures, numbness, or tingling. Denies depression, anxiety or insomnia. Denies skin break down or rash.        Objective:   Physical Exam  BP 120/82 mmHg  Pulse 117  Temp(Src) 98.8 F (37.1 C) (Oral)  Resp 16  Ht 5\' 3"  (1.6 m)  Wt 193 lb (87.544 kg)  BMI 34.20 kg/m2  SpO2 96% Patient alert and oriented and in no cardiopulmonary distress.  HEENT: No facial asymmetry, EOMI,   oropharynx pink and moist.  Neck supple no JVD, no mass. Frontal sinus pressure, no cervical adenopathy, no tender lymph nodes in neck, TM clear Chest: Clear to auscultation bilaterally.  CVS: S1, S2 no murmurs, no S3.Regular rate.  ABD: Soft non tender.   Ext: No edema  MS: Adequate ROM spine, shoulders, hips and knees.  Skin: Intact, no ulcerations or rash noted.  Psych: Good eye contact, normal affect. Memory intact not anxious or depressed appearing.  CNS: CN 2-12 intact, power,  normal throughout.no focal deficits noted.       Assessment & Plan:  Acute frontal sinusitis z pack o prescribed, also cough suppressant and short course of prednisone  Nocturnal cough Increased and uncontrolled with acute illness, suppressant prescribed  Obesity Deteriorated. Patient re-educated about  the importance of commitment to a  minimum of 150 minutes of exercise per week.  The importance of healthy food choices with portion  control discussed. Encouraged to start a food diary, count calories and to consider  joining a support group. Sample diet sheets offered. Goals set by the patient for the next several months.   Weight /BMI 12/02/2015 10/15/2015 07/14/2015  WEIGHT 193 lb 188 lb 188 lb 0.6 oz  HEIGHT 5\' 3"  5\' 3"  5\' 3"   BMI 34.2 kg/m2 33.31 kg/m2 33.32 kg/m2    Current exercise per week 60 minutes.   Essential hypertension Controlled, no change in medication DASH diet and commitment to daily physical activity for a minimum of 30 minutes discussed and encouraged, as a part of hypertension management. The importance of attaining a healthy weight is also discussed.  BP/Weight 12/02/2015 10/15/2015 07/14/2015 10/23/2014 04/23/2014 10/22/2013 123456  Systolic BP 123456 [id number] 123456 123456 [id number]  Diastolic BP 82 88 82 82 84 84 80  Wt. (Lbs) 193 188 188.04 182.2 187.8 191.4 191  BMI 34.2 33.31 33.32 32.28 33.28 33.91 33.84

## 2015-12-09 ENCOUNTER — Encounter: Payer: BLUE CROSS/BLUE SHIELD | Admitting: Family Medicine

## 2015-12-11 ENCOUNTER — Ambulatory Visit: Payer: BLUE CROSS/BLUE SHIELD | Admitting: Family Medicine

## 2015-12-24 ENCOUNTER — Ambulatory Visit (HOSPITAL_COMMUNITY)
Admission: RE | Admit: 2015-12-24 | Discharge: 2015-12-24 | Disposition: A | Discharge status: Home / Self Care | Payer: BLUE CROSS/BLUE SHIELD | Source: Ambulatory Visit | Attending: Family Medicine | Admitting: Family Medicine

## 2015-12-24 DIAGNOSIS — Z1231 Encounter for screening mammogram for malignant neoplasm of breast: Secondary | ICD-10-CM

## 2015-12-25 ENCOUNTER — Ambulatory Visit (INDEPENDENT_AMBULATORY_CARE_PROVIDER_SITE_OTHER): Payer: BLUE CROSS/BLUE SHIELD | Admitting: Family Medicine

## 2015-12-25 ENCOUNTER — Encounter: Payer: Self-pay | Admitting: Family Medicine

## 2015-12-25 VITALS — BP 124/80 | HR 91 | Resp 16 | Ht 63.0 in | Wt 197.0 lb

## 2015-12-25 DIAGNOSIS — Z1159 Encounter for screening for other viral diseases: Secondary | ICD-10-CM

## 2015-12-25 DIAGNOSIS — R928 Other abnormal and inconclusive findings on diagnostic imaging of breast: Secondary | ICD-10-CM | POA: Diagnosis not present

## 2015-12-25 DIAGNOSIS — Z0000 Encounter for general adult medical examination without abnormal findings: Secondary | ICD-10-CM | POA: Diagnosis not present

## 2015-12-25 LAB — POC HEMOCCULT BLD/STL (OFFICE/1-CARD/DIAGNOSTIC): Fecal Occult Blood, POC: NEGATIVE

## 2015-12-25 NOTE — Progress Notes (Signed)
   Subjective:    Patient ID: Kari Mason, female    DOB: Apr 10, 1966, 50 y.o.   MRN: [id number]  HPI Patient is in for annual physical exam. No other health concerns are expressed or addressed at the visit. Recent labs, if available are reviewed. Immunization is reviewed , and  updated if needed.   Review of Systems    See HPI   Objective:   Physical Exam BP 124/80 mmHg  Pulse 91  Resp 16  Ht 5\' 3"  (1.6 m)  Wt 197 lb (89.359 kg)  BMI 34.91 kg/m2  SpO2 97%  LMP 12/18/2015 Pleasant well nourished female, alert and oriented x 3, in no cardio-pulmonary distress. Afebrile. HEENT No facial trauma or asymetry. Sinuses non tender.  Extra occullar muscles intact, pupils equally reactive to light. External ears normal, tympanic membranes clear. Oropharynx moist, no exudate,fairly good dentition. Neck: supple, no adenopathy,JVD or thyromegaly.No bruits.  Chest: Clear to ascultation bilaterally.No crackles or wheezes. Non tender to palpation  Breast: No asymetry,no masses or lumps. No tenderness. No nipple discharge or inversion. No axillary or supraclavicular adenopathy  Cardiovascular system; Heart sounds normal,  S1 and  S2 ,no S3.  No murmur, or thrill. Apical beat not displaced Peripheral pulses normal.  Abdomen: Soft, non tender, no organomegaly or masses. No bruits. Bowel sounds normal. No guarding, tenderness or rebound.  Rectal:  Normal sphincter tone. No mass.No rectal masses.  Guaiac negative stool.  GU: External genitalia normal female genitalia , female distribution of hair. No lesions. Urethral meatus normal in size, no  Prolapse, no lesions visibly  Present. Bladder non tender. Vagina pink and moist , with no visible lesions , discharge present . Adequate pelvic support no  cystocele or rectocele noted Cervix pink and appears healthy, no lesions or ulcerations noted, no discharge noted from os Uterus normal size, no adnexal masses, no cervical  motion or adnexal tenderness.   Musculoskeletal exam: Full ROM of spine, hips , shoulders and knees. No deformity ,swelling or crepitus noted. No muscle wasting or atrophy.   Neurologic: Cranial nerves 2 to 12 intact. Power, tone ,sensation and reflexes normal throughout. No disturbance in gait. No tremor.  Skin: Intact, no ulceration, erythema , scaling or rash noted. Pigmentation normal throughout  Psych; Normal mood and affect. Judgement and concentration normal        Assessment & Plan:  Annual physical exam Annual exam as documented. Counseling done  re healthy lifestyle involving commitment to 150 minutes exercise per week, heart healthy diet, and attaining healthy weight.The importance of adequate sleep also discussed. Regular seat belt use and home safety, is also discussed. Changes in health habits are decided on by the patient with goals and time frames  set for achieving them. Immunization and cancer screening needs are specifically addressed at this visit.

## 2015-12-25 NOTE — Patient Instructions (Addendum)
F/u in 4.5 month, call if you need me before  Work on changing eating and commitment to daily exercise for 30 minutes each day  Weight loss goal of  2 to 3 pounds per month  PLEASE call and schedule additional imaging of left breast , call breast center  Labs asap  Thanks for choosing [facility], we consider it a privelige to serve you.   All the best for 2017!

## 2015-12-28 DIAGNOSIS — Z0000 Encounter for general adult medical examination without abnormal findings: Secondary | ICD-10-CM | POA: Insufficient documentation

## 2015-12-28 NOTE — Assessment & Plan Note (Signed)

## 2016-01-02 ENCOUNTER — Ambulatory Visit
Admission: RE | Admit: 2016-01-02 | Discharge: 2016-01-02 | Disposition: A | Discharge status: Home / Self Care | Payer: BLUE CROSS/BLUE SHIELD | Source: Ambulatory Visit | Attending: Family Medicine | Admitting: Family Medicine

## 2016-01-02 DIAGNOSIS — R928 Other abnormal and inconclusive findings on diagnostic imaging of breast: Secondary | ICD-10-CM

## 2016-05-12 ENCOUNTER — Ambulatory Visit: Payer: BLUE CROSS/BLUE SHIELD | Admitting: Family Medicine

## 2016-05-21 ENCOUNTER — Ambulatory Visit (INDEPENDENT_AMBULATORY_CARE_PROVIDER_SITE_OTHER): Payer: BLUE CROSS/BLUE SHIELD | Admitting: Family Medicine

## 2016-05-21 ENCOUNTER — Encounter: Payer: Self-pay | Admitting: Family Medicine

## 2016-05-21 VITALS — BP 132/82 | HR 98 | Resp 16 | Ht 63.0 in | Wt 188.4 lb

## 2016-05-21 DIAGNOSIS — F439 Reaction to severe stress, unspecified: Secondary | ICD-10-CM | POA: Insufficient documentation

## 2016-05-21 DIAGNOSIS — E669 Obesity, unspecified: Secondary | ICD-10-CM

## 2016-05-21 DIAGNOSIS — R7302 Impaired glucose tolerance (oral): Secondary | ICD-10-CM

## 2016-05-21 DIAGNOSIS — Z1159 Encounter for screening for other viral diseases: Secondary | ICD-10-CM | POA: Diagnosis not present

## 2016-05-21 DIAGNOSIS — Z23 Encounter for immunization: Secondary | ICD-10-CM

## 2016-05-21 DIAGNOSIS — I10 Essential (primary) hypertension: Secondary | ICD-10-CM | POA: Diagnosis not present

## 2016-05-21 DIAGNOSIS — Z638 Other specified problems related to primary support group: Secondary | ICD-10-CM

## 2016-05-21 DIAGNOSIS — K219 Gastro-esophageal reflux disease without esophagitis: Secondary | ICD-10-CM

## 2016-05-21 DIAGNOSIS — Z13220 Encounter for screening for lipoid disorders: Secondary | ICD-10-CM | POA: Diagnosis not present

## 2016-05-21 DIAGNOSIS — J3089 Other allergic rhinitis: Secondary | ICD-10-CM

## 2016-05-21 LAB — COMPREHENSIVE METABOLIC PANEL
ALT: 12 U/L (ref 6–29)
AST: 18 U/L (ref 10–35)
Albumin: 4.4 g/dL (ref 3.6–5.1)
Alkaline Phosphatase: 58 U/L (ref 33–130)
BILIRUBIN TOTAL: 0.5 mg/dL (ref 0.2–1.2)
BUN: 16 mg/dL (ref 7–25)
CO2: 28 mmol/L (ref 20–31)
CREATININE: 0.62 mg/dL (ref 0.50–1.05)
Calcium: 9.6 mg/dL (ref 8.6–10.4)
Chloride: 103 mmol/L (ref 98–110)
GLUCOSE: 94 mg/dL (ref 65–99)
Potassium: 4.3 mmol/L (ref 3.5–5.3)
SODIUM: 140 mmol/L (ref 135–146)
Total Protein: 7.7 g/dL (ref 6.1–8.1)

## 2016-05-21 LAB — CBC
HCT: 36.8 % (ref 35.0–45.0)
Hemoglobin: 11.2 g/dL — ABNORMAL LOW (ref 11.7–15.5)
MCH: 22.4 pg — ABNORMAL LOW (ref 27.0–33.0)
MCHC: 30.4 g/dL — ABNORMAL LOW (ref 32.0–36.0)
MCV: 73.5 fL — AB (ref 80.0–100.0)
MPV: 10.8 fL (ref 7.5–12.5)
PLATELETS: 304 10*3/uL (ref 140–400)
RBC: 5.01 MIL/uL (ref 3.80–5.10)
RDW: 15.7 % — AB (ref 11.0–15.0)
WBC: 5.5 10*3/uL (ref 3.8–10.8)

## 2016-05-21 LAB — LIPID PANEL
CHOLESTEROL: 198 mg/dL (ref 125–200)
HDL: 76 mg/dL (ref >=46)
LDL CALC: 110 mg/dL (ref <130)
Total CHOL/HDL Ratio: 2.6 Ratio (ref <=5.0)
Triglycerides: 60 mg/dL (ref <150)
VLDL: 12 mg/dL (ref <30)

## 2016-05-21 LAB — TSH: TSH: 1.41 m[IU]/L

## 2016-05-21 MED ORDER — LISINOPRIL-HYDROCHLOROTHIAZIDE 20-12.5 MG PO TABS: ORAL_TABLET | ORAL | Status: DC

## 2016-05-21 MED ORDER — CETIRIZINE HCL 10 MG PO TABS: 10.0000 mg | ORAL_TABLET | Freq: Every day | ORAL | Status: DC

## 2016-05-21 MED ORDER — RANITIDINE HCL 150 MG PO CAPS: 150.0000 mg | ORAL_CAPSULE | Freq: Two times a day (BID) | ORAL | Status: DC

## 2016-05-21 NOTE — Assessment & Plan Note (Signed)
Controlled, no change in medication DASH diet and commitment to daily physical activity for a minimum of 30 minutes discussed and encouraged, as a part of hypertension management. The importance of attaining a healthy weight is also discussed.  BP/Weight 05/21/2016 12/25/2015 12/02/2015 10/15/2015 07/14/2015 10/23/2014 123456  Systolic BP [id number] 123456 [id number] 123456 123456 [id number]  Diastolic BP 82 80 82 88 82 82 84  Wt. (Lbs) 188.4 197 193 188 188.04 182.2 187.8  BMI 33.38 34.91 34.2 33.31 33.32 32.28 33.28

## 2016-05-21 NOTE — Assessment & Plan Note (Signed)
After obtaining informed consent, the vaccine is  administered by LPN.  

## 2016-05-21 NOTE — Patient Instructions (Addendum)
Annual physical exam  Dec 26, 2015  You are referred for therapy  Labs today  Please work on good  health habits so that your health will improve. 1. Commitment to daily physical activity for 30 to 60  minutes, if you are able to do this.  2. Commitment to wise food choices. Aim for half of your  food intake to be vegetable and fruit, one quarter starchy foods, and one quarter protein. Try to eat on a regular schedule  3 meals per day, snacking between meals should be limited to vegetables or fruits or small portions of nuts. 64 ounces of water per day is generally recommended, unless you have specific health conditions, like heart failure or kidney failure where you will need to limit fluid intake.  3. Commitment to sufficient and a  good quality of physical and mental rest daily, generally between 6 to 8 hours per day.  WITH PERSISTANCE AND PERSEVERANCE, THE IMPOSSIBLE , BECOMES THE NORM!  Tetanus today   You are referred to therapist

## 2016-05-21 NOTE — Assessment & Plan Note (Signed)
Increased and uncontrolled , will refer to therapist, which she desires and which will be beneficial Negative depression screen

## 2016-05-21 NOTE — Assessment & Plan Note (Signed)
Zantac as needed, varies with stress

## 2016-05-21 NOTE — Progress Notes (Signed)
Subjective:    Patient ID: Kari Mason, female    DOB: February 08, 1966, 50 y.o.   MRN: [id number]  HPI   Kari Mason     MRN: [id number]      DOB: 1966/10/25   HPI Kari Mason is here for follow up and re-evaluation of chronic medical conditions, medication management and review of any available recent lab and radiology data.  Preventive health is updated, specifically  Cancer screening and Immunization.   . The PT denies any adverse reactions to current medications since the last visit.  Increased stress and anxiety , feels as thiough her 2 children have turned their back on her, very disappointed with the one who lives in [place] as she feels she is only being used. Interested in therapy at this time   ROS Denies recent fever or chills. Denies sinus pressure, does have increased  nasal congestion,denies ear pain or sore throat. Denies chest congestion, productive cough or wheezing. Denies chest pains, palpitations and leg swelling Denies abdominal pain, nausea, vomiting,diarrhea or constipation.   Denies dysuria, frequency, hesitancy or incontinence. Denies joint pain, swelling and limitation in mobility. Denies headaches, seizures, numbness, or tingling.  Denies skin break down or rash.   PE  BP 132/82 mmHg  Pulse 98  Resp 16  Ht 5\' 3"  (1.6 m)  Wt 188 lb 6.4 oz (85.458 kg)  BMI 33.38 kg/m2  SpO2 98%  Patient alert and oriented and in no cardiopulmonary distress.  HEENT: No facial asymmetry, EOMI,   oropharynx pink and moist.  Neck supple no JVD, no mass.  Chest: Clear to auscultation bilaterally.  CVS: S1, S2 no murmurs, no S3.Regular rate.  ABD: Soft non tender.   Ext: No edema  MS: Adequate ROM spine, shoulders, hips and knees.  Skin: Intact, no ulcerations or rash noted.  Psych: Good eye contact, normal affect. Memory intact ,  anxious and mildly  depressed appearing.  CNS: CN 2-12 intact, power,  normal throughout.no focal deficits  noted.   Assessment & Plan   Essential hypertension Controlled, no change in medication DASH diet and commitment to daily physical activity for a minimum of 30 minutes discussed and encouraged, as a part of hypertension management. The importance of attaining a healthy weight is also discussed.  BP/Weight 05/21/2016 12/25/2015 12/02/2015 10/15/2015 07/14/2015 10/23/2014 123456  Systolic BP Q000111Q A999333 123456 A999333 123456 123456 [id number]  Diastolic BP 82 80 82 88 82 82 84  Wt. (Lbs) 188.4 197 193 188 188.04 182.2 187.8  BMI 33.38 34.91 34.2 33.31 33.32 32.28 33.28        Allergic rhinitis Increased and uncontrolled symptoms, with season change,will start zyrtec as needed  Obesity Improved. Pt applauded on succesful weight loss through lifestyle change, and encouraged to continue same. Weight loss goal set for the next several months.   IGT (impaired glucose tolerance) Patient educated about the importance of limiting  Carbohydrate intake , the need to commit to daily physical activity for a minimum of 30 minutes , and to commit weight loss. The fact that changes in all these areas will reduce or eliminate all together the development of diabetes is stressed.   Diabetic Labs Latest Ref Rng 10/23/2014 04/26/2014 04/18/2013 04/11/2012 06/02/2011  HbA1c <5.7 % 5.8(H) 5.9(H) 5.6 5.7(H) -  Chol 0 - 200 mg/dL - 170 - 160 -  HDL >39 mg/dL - 64 - 55 -  Calc LDL 0 - 99 mg/dL - 94 - 96 -  Triglycerides <150 mg/dL -  60 - 45 -  Creatinine 0.50 - 1.10 mg/dL 0.72 0.59 0.64 0.63 0.60   BP/Weight 05/21/2016 12/25/2015 12/02/2015 10/15/2015 07/14/2015 10/23/2014 123456  Systolic BP Q000111Q A999333 123456 A999333 123456 123456 A999333  Diastolic BP 82 80 82 88 82 82 84  Wt. (Lbs) 188.4 197 193 188 188.04 182.2 187.8  BMI 33.38 34.91 34.2 33.31 33.32 32.28 33.28   No flowsheet data found.   Updated lab today     GERD (gastroesophageal reflux disease) Zantac as needed, varies with stress  Stress at home Increased and uncontrolled ,  will refer to therapist, which she desires and which will be beneficial Negative depression screen  Need for Tdap vaccination After obtaining informed consent, the vaccine is  administered by LPN.        Review of Systems     Objective:   Physical Exam        Assessment & Plan:

## 2016-05-21 NOTE — Assessment & Plan Note (Signed)
Patient educated about the importance of limiting  Carbohydrate intake , the need to commit to daily physical activity for a minimum of 30 minutes , and to commit weight loss. The fact that changes in all these areas will reduce or eliminate all together the development of diabetes is stressed.   Diabetic Labs Latest Ref Rng 10/23/2014 04/26/2014 04/18/2013 04/11/2012 06/02/2011  HbA1c <5.7 % 5.8(H) 5.9(H) 5.6 5.7(H) -  Chol 0 - 200 mg/dL - 170 - 160 -  HDL >39 mg/dL - 64 - 55 -  Calc LDL 0 - 99 mg/dL - 94 - 96 -  Triglycerides <150 mg/dL - 60 - 45 -  Creatinine 0.50 - 1.10 mg/dL 0.72 0.59 0.64 0.63 0.60   BP/Weight 05/21/2016 12/25/2015 12/02/2015 10/15/2015 07/14/2015 10/23/2014 123456  Systolic BP Q000111Q A999333 123456 A999333 123456 123456 A999333  Diastolic BP 82 80 82 88 82 82 84  Wt. (Lbs) 188.4 197 193 188 188.04 182.2 187.8  BMI 33.38 34.91 34.2 33.31 33.32 32.28 33.28   No flowsheet data found.   Updated lab today

## 2016-05-21 NOTE — Assessment & Plan Note (Signed)
Improved. Pt applauded on succesful weight loss through lifestyle change, and encouraged to continue same. Weight loss goal set for the next several months.  

## 2016-05-21 NOTE — Assessment & Plan Note (Signed)
Increased and uncontrolled symptoms, with season change,will start zyrtec as needed

## 2016-05-22 LAB — HEMOGLOBIN A1C
HEMOGLOBIN A1C: 5.5 % (ref <5.7)
MEAN PLASMA GLUCOSE: 111 mg/dL

## 2016-05-24 LAB — HIV ANTIBODY (ROUTINE TESTING W REFLEX): HIV: NONREACTIVE

## 2016-05-26 ENCOUNTER — Telehealth (HOSPITAL_COMMUNITY): Payer: Self-pay | Admitting: *Deleted

## 2016-05-26 NOTE — Telephone Encounter (Signed)
left voice message regarding an appointment. 

## 2016-05-31 ENCOUNTER — Telehealth (HOSPITAL_COMMUNITY): Payer: Self-pay | Admitting: *Deleted

## 2016-05-31 NOTE — Telephone Encounter (Signed)
left voice message regarding an appointment. 

## 2016-07-31 ENCOUNTER — Emergency Department (HOSPITAL_COMMUNITY)
Admission: EM | Admit: 2016-07-31 | Discharge: 2016-07-31 | Disposition: A | Discharge status: Home / Self Care | Payer: BLUE CROSS/BLUE SHIELD | Attending: Emergency Medicine | Admitting: Emergency Medicine

## 2016-07-31 ENCOUNTER — Encounter (HOSPITAL_COMMUNITY): Payer: Self-pay | Admitting: *Deleted

## 2016-07-31 DIAGNOSIS — Z79899 Other long term (current) drug therapy: Secondary | ICD-10-CM | POA: Insufficient documentation

## 2016-07-31 DIAGNOSIS — Y999 Unspecified external cause status: Secondary | ICD-10-CM | POA: Diagnosis not present

## 2016-07-31 DIAGNOSIS — I10 Essential (primary) hypertension: Secondary | ICD-10-CM | POA: Insufficient documentation

## 2016-07-31 DIAGNOSIS — Y939 Activity, unspecified: Secondary | ICD-10-CM | POA: Insufficient documentation

## 2016-07-31 DIAGNOSIS — Y929 Unspecified place or not applicable: Secondary | ICD-10-CM | POA: Diagnosis not present

## 2016-07-31 DIAGNOSIS — X58XXXA Exposure to other specified factors, initial encounter: Secondary | ICD-10-CM | POA: Insufficient documentation

## 2016-07-31 DIAGNOSIS — T161XXA Foreign body in right ear, initial encounter: Secondary | ICD-10-CM | POA: Diagnosis not present

## 2016-07-31 MED ORDER — AMOXICILLIN 250 MG PO CAPS
500.0000 mg | ORAL_CAPSULE | Freq: Once | ORAL | Status: AC
  Administered 2016-07-31: 500 mg via ORAL
  Filled 2016-07-31: qty 2

## 2016-07-31 MED ORDER — AMOXICILLIN 500 MG PO CAPS: 500.0000 mg | ORAL_CAPSULE | Freq: Three times a day (TID) | ORAL | 0 refills | Status: DC

## 2016-07-31 MED ORDER — NEOMYCIN-POLYMYXIN-HC 1 % OT SOLN
4.0000 [drp] | Freq: Once | OTIC | Status: AC
  Administered 2016-07-31: 4 [drp] via OTIC
  Filled 2016-07-31: qty 10

## 2016-07-31 MED ORDER — HYDROCODONE-ACETAMINOPHEN 5-325 MG PO TABS: 1.0000 | ORAL_TABLET | ORAL | 0 refills | Status: DC | PRN

## 2016-07-31 MED ORDER — HYDROCODONE-ACETAMINOPHEN 5-325 MG PO TABS
2.0000 | ORAL_TABLET | Freq: Once | ORAL | Status: AC
  Administered 2016-07-31: 2 via ORAL
  Filled 2016-07-31: qty 2

## 2016-07-31 NOTE — ED Notes (Signed)
Attempted to flush FB out of right ear without success.

## 2016-07-31 NOTE — Discharge Instructions (Signed)
There are scratches in the canal appeared ear, and scratches and some injury to the eardrum. Please use 4 drops of Cortisporin to the right ear 3 times daily for the next 7 days. Please use Amoxil 3 times daily with a meal. Use Tylenol or ibuprofen for mild pain, may use Norco for more severe pain. Please see Dr. Moshe Cipro for additional evaluation if not improving.

## 2016-07-31 NOTE — ED Provider Notes (Signed)
[facility] DEPT Provider Note   CSN: [id number] Arrival date & time: 07/31/16  1947  By signing my name below, I, Jeanell Sparrow, attest that this documentation has been prepared under the direction and in the presence of non-physician practitioner, Lily Kocher, PA-C. Electronically Signed: Jeanell Sparrow, Scribe. 07/31/2016. 8:26 PM.  History   Chief Complaint Chief Complaint  Patient presents with  . Foreign Body in [place]   The history is provided by the patient. No language interpreter was used.  Foreign Body in Ear  This is a new problem. The current episode started 3 to 5 hours ago. The problem occurs constantly. The problem has not changed since onset.Nothing aggravates the symptoms. Nothing relieves the symptoms.   HPI Comments: Kari Mason is a 50 y.o. female who presents to the Emergency Department complaining of a right ear foreign body that started today. She states that a bug flew into her right ear, and she heard a buzzing sound until her neighbor put baby oil in her ear. She reports that the bug is dead, but still in her ear. She denies any known allergies.    Past Medical History:  Diagnosis Date  . Allergy   . GERD (gastroesophageal reflux disease)   . Hypertension     Patient Active Problem List   Diagnosis Date Noted  . Stress at home 05/21/2016  . Need for Tdap vaccination 05/21/2016  . Tubular adenoma of colon 10/17/2015  . Obesity 07/14/2015  . GERD (gastroesophageal reflux disease) 04/23/2014  . IGT (impaired glucose tolerance) 04/16/2012  . Essential hypertension 12/19/2007  . Allergic rhinitis 12/19/2007    Past Surgical History:  Procedure Laterality Date  . COLONOSCOPY N/A 10/15/2015   Procedure: COLONOSCOPY;  Surgeon: Rogene Houston, MD;  Location: [facility];  Service: Endoscopy;  Laterality: N/A;  1200  . TUBAL LIGATION      OB History    No data available       Home Medications    Prior to Admission medications   Medication  Sig Start Date End Date Taking? Authorizing Provider  lisinopril-hydrochlorothiazide (PRINZIDE,ZESTORETIC) 20-12.5 MG tablet TAKE ONE & ONE-HALF TABLETS BY MOUTH ONCE DAILY 05/21/16  Yes [name], MD  cetirizine (ZYRTEC) 10 MG tablet Take 1 tablet (10 mg total) by mouth daily. Take 10 mg by mouth daily. Patient not taking: Reported on 07/31/2016 05/21/16   [name], MD  ranitidine (ZANTAC) 150 MG capsule Take 1 capsule (150 mg total) by mouth 2 (two) times daily. Patient not taking: Reported on 07/31/2016 05/21/16   [name], MD    Family History History reviewed. No pertinent family history.  Social History Social History  Substance Use Topics  . Smoking status: Never Smoker  . Smokeless tobacco: Never Used  . Alcohol use No     Allergies   Review of patient's allergies indicates no known allergies.   Review of Systems Review of Systems  HENT: Positive for ear pain (Foreign Body).   All other systems reviewed and are negative.    Physical Exam Updated Vital Signs BP 139/83 (BP Location: Left Arm)   Pulse 87   Temp 97.6 F (36.4 C) (Oral)   Resp 16   Ht 5\' 3"  (1.6 m)   Wt 170 lb (77.1 kg)   LMP 07/30/2016   SpO2 100%   BMI 30.11 kg/m   Physical Exam  Constitutional: She appears well-developed and well-nourished. No distress.  HENT:  Head: Normocephalic and atraumatic.  Foreign body consistent with insect in the right ear. Shallow scratches of the external auditory canal of the right. Scratch/puncture of the right TM. No pre- or post- auricular nodes.  Eyes: EOM are normal.  Neck: Neck supple.  Cardiovascular: Normal rate, regular rhythm and normal heart sounds.   No murmur heard. Pulmonary/Chest: Effort normal. No respiratory distress. She has no wheezes. She has no rales.  Abdominal: Soft.  Lymphadenopathy:    She has no cervical adenopathy.  Neurological: She is alert.  Skin: Skin is warm and dry.  Psychiatric: She has a normal  mood and affect.  Nursing note and vitals reviewed.    ED Treatments / Results  DIAGNOSTIC STUDIES: Oxygen Saturation is 100% on RA, normal by my interpretation.    COORDINATION OF CARE: 8:30 PM- Pt advised of plan for treatment, which includes medication, and pt agrees.  Labs (all labs ordered are listed, but only abnormal results are displayed) Labs Reviewed - No data to display  EKG  EKG Interpretation None       Radiology No results found.  Procedures .Foreign Body Removal Date/Time: 07/31/2016 9:15 PM Performed by: Lily Kocher Authorized by: Lily Kocher  Consent: Verbal consent obtained. Risks and benefits: risks, benefits and alternatives were discussed Consent given by: patient Patient understanding: patient states understanding of the procedure being performed Patient identity confirmed: arm band Time out: Immediately prior to procedure a "time out" was called to verify the correct patient, procedure, equipment, support staff and site/side marked as required. Body area: ear Location details: right ear  Sedation: Patient sedated: no Patient cooperative: yes Localization method: ENT speculum Removal mechanism: irrigation Complexity: simple 1 objects recovered. Objects recovered: bug Post-procedure assessment: foreign body removed Patient tolerance: Patient tolerated the procedure well with no immediate complications   (including critical care time)  Medications Ordered in ED Medications  NEOMYCIN-POLYMYXIN-HYDROCORTISONE (CORTISPORIN) otic solution 4 drop (not administered)  amoxicillin (AMOXIL) capsule 500 mg (not administered)  HYDROcodone-acetaminophen (NORCO/VICODIN) 5-325 MG per tablet 2 tablet (not administered)     Initial Impression / Assessment and Plan / ED Course  I have reviewed the triage vital signs and the nursing notes.  Pertinent labs & imaging results that were available during my care of the patient were reviewed by me and  considered in my medical decision making (see chart for details).  Clinical Course    **I have reviewed nursing notes, vital signs, and all appropriate lab and imaging results for this patient.*  Final Clinical Impressions(s) / ED Diagnoses  Patient had a bug to lie in her ear. Foreign body removed. The examination reveals scratches of the canal, and scratches versus injury to the tympanic membrane. The patient is treated with Cortisporin otic suspension as well as Amoxil. Patient will use Tylenol and ibuprofen for mild pain, she'll use Norco for more severe pain. She will follow-up with Dr. Moshe Cipro, or return to the emergency department if any changes, problems, or concerns.    Final diagnoses:  Foreign body in right ear, initial encounter    New Prescriptions New Prescriptions   No medications on file   **I personally performed the services described in this documentation, which was scribed in my presence. The recorded information has been reviewed and is accurate.*    Lily Kocher, PA-C 07/31/16 2120    Francine Graven, DO 08/05/16 1341

## 2016-07-31 NOTE — ED Notes (Signed)
Instructed pt to take all of antibiotics as prescribed.Pt verbalized understanding of no driving and to use caution within 4 hours of taking pain meds due to meds cause drowsiness 

## 2016-07-31 NOTE — ED Triage Notes (Signed)
Pt states she saw a big white bur flying and it went into her right ear. Pt states she could hear the bug buzzing until her friend put bay oil in her ear.

## 2016-11-03 ENCOUNTER — Telehealth: Payer: Self-pay | Admitting: Family Medicine

## 2016-11-03 MED ORDER — MECLIZINE HCL 25 MG PO TABS: 25.0000 mg | ORAL_TABLET | Freq: Three times a day (TID) | ORAL | 0 refills | Status: DC | PRN

## 2016-11-03 NOTE — Telephone Encounter (Signed)
Calling stating that she is feeling light headed and her pulse is running high, blood pressure is normal, please advise?

## 2016-11-03 NOTE — Telephone Encounter (Signed)
Pl send antivert 25 mg three times daily as needed #15 only if worsens UC

## 2016-11-03 NOTE — Telephone Encounter (Signed)
Patient states that dizziness started yesterday evening.  No changes with medication.  No noted sinus problems.  Please advise.   Patient is at work.

## 2016-11-03 NOTE — Telephone Encounter (Signed)
Called and left voicemail for patient notifying of rx.  Requested that she call office back to update on condition and confirm that she received message.

## 2016-12-27 ENCOUNTER — Other Ambulatory Visit (HOSPITAL_COMMUNITY)
Admission: RE | Admit: 2016-12-27 | Discharge: 2016-12-27 | Disposition: A | Discharge status: Home / Self Care | Payer: BLUE CROSS/BLUE SHIELD | Source: Ambulatory Visit | Attending: Family Medicine | Admitting: Family Medicine

## 2016-12-27 ENCOUNTER — Ambulatory Visit (INDEPENDENT_AMBULATORY_CARE_PROVIDER_SITE_OTHER): Payer: BLUE CROSS/BLUE SHIELD | Admitting: Family Medicine

## 2016-12-27 ENCOUNTER — Other Ambulatory Visit: Payer: Self-pay | Admitting: Pediatrics

## 2016-12-27 ENCOUNTER — Encounter: Payer: Self-pay | Admitting: Family Medicine

## 2016-12-27 VITALS — BP 122/84 | HR 71 | Resp 16 | Ht 63.0 in | Wt 201.0 lb

## 2016-12-27 DIAGNOSIS — Z1151 Encounter for screening for human papillomavirus (HPV): Secondary | ICD-10-CM | POA: Diagnosis not present

## 2016-12-27 DIAGNOSIS — Z0000 Encounter for general adult medical examination without abnormal findings: Secondary | ICD-10-CM

## 2016-12-27 DIAGNOSIS — Z1211 Encounter for screening for malignant neoplasm of colon: Secondary | ICD-10-CM | POA: Diagnosis not present

## 2016-12-27 DIAGNOSIS — Z124 Encounter for screening for malignant neoplasm of cervix: Secondary | ICD-10-CM

## 2016-12-27 DIAGNOSIS — B078 Other viral warts: Secondary | ICD-10-CM | POA: Insufficient documentation

## 2016-12-27 DIAGNOSIS — Z1231 Encounter for screening mammogram for malignant neoplasm of breast: Secondary | ICD-10-CM

## 2016-12-27 DIAGNOSIS — D649 Anemia, unspecified: Secondary | ICD-10-CM | POA: Diagnosis not present

## 2016-12-27 DIAGNOSIS — Z01419 Encounter for gynecological examination (general) (routine) without abnormal findings: Secondary | ICD-10-CM | POA: Insufficient documentation

## 2016-12-27 DIAGNOSIS — E559 Vitamin D deficiency, unspecified: Secondary | ICD-10-CM | POA: Diagnosis not present

## 2016-12-27 DIAGNOSIS — R7302 Impaired glucose tolerance (oral): Secondary | ICD-10-CM

## 2016-12-27 DIAGNOSIS — I10 Essential (primary) hypertension: Secondary | ICD-10-CM | POA: Diagnosis not present

## 2016-12-27 DIAGNOSIS — B079 Viral wart, unspecified: Secondary | ICD-10-CM

## 2016-12-27 LAB — POC HEMOCCULT BLD/STL (OFFICE/1-CARD/DIAGNOSTIC): FECAL OCCULT BLD: NEGATIVE

## 2016-12-27 MED ORDER — LISINOPRIL-HYDROCHLOROTHIAZIDE 20-12.5 MG PO TABS: ORAL_TABLET | ORAL | 1 refills | Status: DC

## 2016-12-27 MED ORDER — CETIRIZINE HCL 10 MG PO TABS: 10.0000 mg | ORAL_TABLET | Freq: Every day | ORAL | 1 refills | Status: DC

## 2016-12-27 NOTE — Progress Notes (Signed)
    Kari Mason     MRN: [id number]      DOB: [birth year]  HPI: Patient is in for annual physical exam. No other health concerns are expressed or addressed at the visit. Recent labs, if available are reviewed. Immunization is reviewed , and  updated if needed.   PE:  BP 122/84   Pulse 71   Resp 16   Ht 5\' 3"  (1.6 m)   Wt 201 lb (91.2 kg)   SpO2 98%   BMI 35.61 kg/m  Pleasant  female, alert and oriented x 3, in no cardio-pulmonary distress. Afebrile. HEENT No facial trauma or asymetry. Sinuses non tender.  Extra occullar muscles intact, pupils equally reactive to light. External ears normal, tympanic membranes clear. Oropharynx moist, no exudate. Neck: supple, no adenopathy,JVD or thyromegaly.No bruits. Wart on left cheek  Chest: Clear to ascultation bilaterally.No crackles or wheezes. Non tender to palpation  Breast: No asymetry,no masses or lumps. No tenderness. No nipple discharge or inversion. No axillary or supraclavicular adenopathy  Cardiovascular system; Heart sounds normal,  S1 and  S2 ,no S3.  No murmur, or thrill. Apical beat not displaced Peripheral pulses normal.  Abdomen: Soft, non tender, no organomegaly or masses. No bruits. Bowel sounds normal. No guarding, tenderness or rebound.  Rectal:  Normal sphincter tone. No rectal mass. Guaiac negative stool.  GU: External genitalia normal female genitalia , normal female distribution of hair. No lesions. Urethral meatus normal in size, no  Prolapse, no lesions visibly  Present. Bladder non tender. Vagina pink and moist , with no visible lesions , discharge present . Adequate pelvic support no  cystocele or rectocele noted Cervix pink and appears healthy, no lesions or ulcerations noted, no discharge noted from os Uterus normal size, no adnexal masses, no cervical motion or adnexal tenderness.   Musculoskeletal exam: Full ROM of spine, hips , shoulders and knees. No deformity ,swelling or  crepitus noted. No muscle wasting or atrophy.   Neurologic: Cranial nerves 2 to 12 intact. Power, tone ,sensation and reflexes normal throughout. No disturbance in gait. No tremor.  Skin: Intact, no ulceration, erythema , scaling or rash noted. Wart on left cheekPigmentation normal throughout  Psych; Normal mood and affect. Judgement and concentration normal   Assessment & Plan:  Annual physical exam Annual exam as documented. Counseling done  re healthy lifestyle involving commitment to 150 minutes exercise per week, heart healthy diet, and attaining healthy weight.The importance of adequate sleep also discussed. Regular seat belt use and home safety, is also discussed. Changes in health habits are decided on by the patient with goals and time frames  set for achieving them. Immunization and cancer screening needs are specifically addressed at this visit.   Verruca vulgaris Enlarging wart on left cheek wants removed, will refer to derm

## 2016-12-27 NOTE — Assessment & Plan Note (Signed)
Enlarging wart on left cheek wants removed, will refer to derm

## 2016-12-27 NOTE — Assessment & Plan Note (Signed)

## 2016-12-27 NOTE — Patient Instructions (Addendum)
F/u in 6 month, call if you need me sooner  You are referred for removal of wart on left cheek  Fasting lipid, cmp and eGFR, HBA1C , iron and ferritin and it D tomorrow morning  Pls get number from check out desk for breast  Center to schedule your mammogram  Please work on good  health habits so that your health will improve. 1. Commitment to daily physical activity for 30 to 60  minutes, if you are able to do this.  2. Commitment to wise food choices. Aim for half of your  food intake to be vegetable and fruit, one quarter starchy foods, and one quarter protein. Try to eat on a regular schedule  3 meals per day, snacking between meals should be limited to vegetables or fruits or small portions of nuts. 64 ounces of water per day is generally recommended, unless you have specific health conditions, like heart failure or kidney failure where you will need to limit fluid intake.  3. Commitment to sufficient and a  good quality of physical and mental rest daily, generally between 6 to 8 hours per day.  WITH PERSISTANCE AND PERSEVERANCE, THE IMPOSSIBLE , BECOMES THE NORM! Weight loss goal of 10 to 12 pounds.  Thank you  for choosing [facility]. We consider it a privelige to serve you.  Delivering excellent health care in a caring and  compassionate way is our goal.  Partnering with you,  so that together we can achieve this goal is our strategy.

## 2016-12-28 LAB — COMPLETE METABOLIC PANEL WITH GFR
ALBUMIN: 3.6 g/dL (ref 3.6–5.1)
ALK PHOS: 58 U/L (ref 33–130)
ALT: 12 U/L (ref 6–29)
AST: 19 U/L (ref 10–35)
BILIRUBIN TOTAL: 0.4 mg/dL (ref 0.2–1.2)
BUN: 12 mg/dL (ref 7–25)
CO2: 29 mmol/L (ref 20–31)
CREATININE: 0.6 mg/dL (ref 0.50–1.05)
Calcium: 9 mg/dL (ref 8.6–10.4)
Chloride: 105 mmol/L (ref 98–110)
GFR, Est African American: >89 mL/min (ref >=60)
Glucose, Bld: 97 mg/dL (ref 65–99)
Potassium: 4.3 mmol/L (ref 3.5–5.3)
SODIUM: 140 mmol/L (ref 135–146)
TOTAL PROTEIN: 6.7 g/dL (ref 6.1–8.1)

## 2016-12-28 LAB — LIPID PANEL
Cholesterol: 168 mg/dL (ref <200)
HDL: 62 mg/dL (ref >50)
LDL Cholesterol: 92 mg/dL (ref <100)
TRIGLYCERIDES: 70 mg/dL (ref <150)
Total CHOL/HDL Ratio: 2.7 Ratio (ref <5.0)
VLDL: 14 mg/dL (ref <30)

## 2016-12-28 LAB — FERRITIN: FERRITIN: 39 ng/mL (ref 10–232)

## 2016-12-28 LAB — IRON: Iron: 87 ug/dL (ref 45–160)

## 2016-12-29 LAB — HEMOGLOBIN A1C
Hgb A1c MFr Bld: 5.4 % (ref <5.7)
Mean Plasma Glucose: 108 mg/dL

## 2016-12-29 LAB — CYTOLOGY - PAP
Diagnosis: NEGATIVE
HPV (WINDOPATH): NOT DETECTED

## 2016-12-29 LAB — VITAMIN D 25 HYDROXY (VIT D DEFICIENCY, FRACTURES): Vit D, 25-Hydroxy: 35 ng/mL (ref 30–100)

## 2017-01-11 ENCOUNTER — Ambulatory Visit
Admission: RE | Admit: 2017-01-11 | Discharge: 2017-01-11 | Disposition: A | Discharge status: Home / Self Care | Payer: BLUE CROSS/BLUE SHIELD | Source: Ambulatory Visit | Attending: Pediatrics | Admitting: Pediatrics

## 2017-01-11 DIAGNOSIS — Z1231 Encounter for screening mammogram for malignant neoplasm of breast: Secondary | ICD-10-CM

## 2017-03-02 ENCOUNTER — Other Ambulatory Visit: Payer: Self-pay

## 2017-03-02 ENCOUNTER — Telehealth: Payer: Self-pay | Admitting: Family Medicine

## 2017-03-02 MED ORDER — LISINOPRIL-HYDROCHLOROTHIAZIDE 20-12.5 MG PO TABS: ORAL_TABLET | ORAL | 1 refills | Status: DC

## 2017-03-02 NOTE — Telephone Encounter (Signed)
Patient is requesting blood pressure medication says her script is expired. Pharmacy: [facility] in [place] cb#: [phone]

## 2017-03-02 NOTE — Telephone Encounter (Signed)
Done

## 2017-06-27 ENCOUNTER — Encounter: Payer: Self-pay | Admitting: Family Medicine

## 2017-06-27 ENCOUNTER — Ambulatory Visit (INDEPENDENT_AMBULATORY_CARE_PROVIDER_SITE_OTHER): Payer: BLUE CROSS/BLUE SHIELD | Admitting: Family Medicine

## 2017-06-27 VITALS — BP 124/78 | HR 97 | Resp 16 | Ht 63.0 in | Wt 203.0 lb

## 2017-06-27 DIAGNOSIS — E559 Vitamin D deficiency, unspecified: Secondary | ICD-10-CM | POA: Diagnosis not present

## 2017-06-27 DIAGNOSIS — J302 Other seasonal allergic rhinitis: Secondary | ICD-10-CM | POA: Diagnosis not present

## 2017-06-27 DIAGNOSIS — I10 Essential (primary) hypertension: Secondary | ICD-10-CM | POA: Diagnosis not present

## 2017-06-27 DIAGNOSIS — D126 Benign neoplasm of colon, unspecified: Secondary | ICD-10-CM | POA: Diagnosis not present

## 2017-06-27 DIAGNOSIS — R7302 Impaired glucose tolerance (oral): Secondary | ICD-10-CM | POA: Diagnosis not present

## 2017-06-27 NOTE — Progress Notes (Signed)
   Kari Mason     MRN: [id number]      DOB: Apr 20, 1966   HPI [name] is here for follow up and re-evaluation of chronic medical conditions, medication management and review of any available recent lab and radiology data.  Preventive health is updated, specifically  Cancer screening and Immunization.   Questions or concerns regarding consultations or procedures which the PT has had in the interim are  addressed. The PT denies any adverse reactions to current medications since the last visit.  There are no new concerns.  There are no specific complaints   ROS Denies recent fever or chills. Denies sinus pressure, nasal congestion, ear pain or sore throat. Denies chest congestion, productive cough or wheezing. Denies chest pains, palpitations and leg swelling Denies abdominal pain, nausea, vomiting,diarrhea or constipation.   Denies dysuria, frequency, hesitancy or incontinence. Denies joint pain, swelling and limitation in mobility. Denies headaches, seizures, numbness, or tingling. Denies depression, anxiety or insomnia. Denies skin break down or rash.   PE  BP 124/78   Pulse 97   Resp 16   Ht 5\' 3"  (1.6 m)   Wt 203 lb (92.1 kg)   SpO2 99%   BMI 35.96 kg/m   Patient alert and oriented and in no cardiopulmonary distress.  HEENT: No facial asymmetry, EOMI,   oropharynx pink and moist.  Neck supple no JVD, no mass.  Chest: Clear to auscultation bilaterally.  CVS: S1, S2 no murmurs, no S3.Regular rate.  ABD: Soft non tender.   Ext: No edema  MS: Adequate ROM spine, shoulders, hips and knees.  Skin: Intact, no ulcerations or rash noted.  Psych: Good eye contact, normal affect. Memory intact not anxious or depressed appearing.  CNS: CN 2-12 intact, power,  normal throughout.no focal deficits noted.   Assessment & Plan  Essential hypertension Controlled, no change in medication DASH diet and commitment to daily physical activity for a minimum of 30 minutes  discussed and encouraged, as a part of hypertension management. The importance of attaining a healthy weight is also discussed.  BP/Weight 06/27/2017 12/27/2016 07/31/2016 05/21/2016 12/25/2015 12/02/2015 67/02/4192  Systolic BP 790 240 973 532 992 426 834  Diastolic BP 78 84 83 82 80 82 88  Wt. (Lbs) 203 201 170 188.4 197 193 188  BMI 35.96 35.61 30.11 33.38 34.91 34.2 33.31       Allergic rhinitis No recent flare Controlled, no change in medication   Obesity Deteriorated. Patient re-educated about  the importance of commitment to a  minimum of 150 minutes of exercise per week.  The importance of healthy food choices with portion control discussed. Encouraged to start a food diary, count calories and to consider  joining a support group. Sample diet sheets offered. Goals set by the patient for the next several months.   Weight /BMI 06/27/2017 12/27/2016 07/31/2016  WEIGHT 203 lb 201 lb 170 lb  HEIGHT 5\' 3"  5\' 3"  5\' 3"   BMI 35.96 kg/m2 35.61 kg/m2 30.11 kg/m2      Tubular adenoma of colon Repeat colonoscopy due in 2019

## 2017-06-27 NOTE — Patient Instructions (Addendum)
Physical exam Jan 23 or after, call if you need me before   CBC, chem 7  And vit D fasting in the next week please Please  Get flu vaccine in the Fall and let us know  Colonoscopy is due next March  It is important that you exercise regularly at least 30 minutes 5 times a week. If you develop chest pain, have severe difficulty breathing, or feel very tired, stop exercising immediately and seek medical attention     Please work on good  health habits so that your health will improve. 1. Commitment to daily physical activity for 30 to 60  minutes, if you are able to do this.  2. Commitment to wise food choices. Aim for half of your  food intake to be vegetable and fruit, one quarter starchy foods, and one quarter protein. Try to eat on a regular schedule  3 meals per day, snacking between meals should be limited to vegetables or fruits or small portions of nuts. 64 ounces of water per day is generally recommended, unless you have specific health conditions, like heart failure or kidney failure where you will need to limit fluid intake.  3. Commitment to sufficient and a  good quality of physical and mental rest daily, generally between 6 to 8 hours per day.  WITH PERSISTANCE AND PERSEVERANCE, THE IMPOSSIBLE , BECOMES THE NORM! EAT LESS ICE CREAM, and more vegetable and less rice

## 2017-06-28 NOTE — Assessment & Plan Note (Signed)
Deteriorated. Patient re-educated about  the importance of commitment to a  minimum of 150 minutes of exercise per week.  The importance of healthy food choices with portion control discussed. Encouraged to start a food diary, count calories and to consider  joining a support group. Sample diet sheets offered. Goals set by the patient for the next several months.   Weight /BMI 06/27/2017 12/27/2016 07/31/2016  WEIGHT 203 lb 201 lb 170 lb  HEIGHT 5\' 3"  5\' 3"  5\' 3"   BMI 35.96 kg/m2 35.61 kg/m2 30.11 kg/m2

## 2017-06-28 NOTE — Assessment & Plan Note (Signed)
Repeat colonoscopy due in 2019 

## 2017-06-28 NOTE — Assessment & Plan Note (Signed)
Controlled, no change in medication DASH diet and commitment to daily physical activity for a minimum of 30 minutes discussed and encouraged, as a part of hypertension management. The importance of attaining a healthy weight is also discussed.  BP/Weight 06/27/2017 12/27/2016 07/31/2016 05/21/2016 12/25/2015 12/02/2015 62/08/5283  Systolic BP 132 440 102 725 366 440 347  Diastolic BP 78 84 83 82 80 82 88  Wt. (Lbs) 203 201 170 188.4 197 193 188  BMI 35.96 35.61 30.11 33.38 34.91 34.2 33.31

## 2017-06-28 NOTE — Assessment & Plan Note (Signed)
No recent flare Controlled, no change in medication

## 2017-06-28 NOTE — Progress Notes (Signed)
   Kari Mason     MRN: [id number]      DOB: [birth year]   HPI Kari Mason is here for follow up and re-evaluation of chronic medical conditions, medication management and review of any available recent lab and radiology data.  Preventive health is updated, specifically  Cancer screening and Immunization.   Questions or concerns regarding consultations or procedures which the PT has had in the interim are  addressed. The PT denies any adverse reactions to current medications since the last visit.  There are no new concerns.  There are no specific complaints   ROS Denies recent fever or chills. Denies sinus pressure, nasal congestion, ear pain or sore throat. Denies chest congestion, productive cough or wheezing. Denies chest pains, palpitations and leg swelling Denies abdominal pain, nausea, vomiting,diarrhea or constipation.   Denies dysuria, frequency, hesitancy or incontinence. Denies joint pain, swelling and limitation in mobility. Denies headaches, seizures, numbness, or tingling. Denies depression, anxiety or insomnia. Denies skin break down or rash.   PE  BP 124/78   Pulse 97   Resp 16   Ht 5\' 3"  (1.6 m)   Wt 203 lb (92.1 kg)   SpO2 99%   BMI 35.96 kg/m   Patient alert and oriented and in no cardiopulmonary distress.  HEENT: No facial asymmetry, EOMI,   oropharynx pink and moist.  Neck supple no JVD, no mass.  Chest: Clear to auscultation bilaterally.  CVS: S1, S2 no murmurs, no S3.Regular rate.  ABD: Soft non tender.   Ext: No edema  MS: Adequate ROM spine, shoulders, hips and knees.  Skin: Intact, no ulcerations or rash noted.  Psych: Good eye contact, normal affect. Memory intact not anxious or depressed appearing.  CNS: CN 2-12 intact, power,  normal throughout.no focal deficits noted.   Assessment & Plan  Essential hypertension Controlled, no change in medication DASH diet and commitment to daily physical activity for a minimum of 30 minutes  discussed and encouraged, as a part of hypertension management. The importance of attaining a healthy weight is also discussed.  BP/Weight 06/27/2017 12/27/2016 07/31/2016 05/21/2016 12/25/2015 12/02/2015 82/08/9370  Systolic BP 696 789 381 017 510 258 527  Diastolic BP 78 84 83 82 80 82 88  Wt. (Lbs) 203 201 170 188.4 197 193 188  BMI 35.96 35.61 30.11 33.38 34.91 34.2 33.31       Allergic rhinitis No recent flare Controlled, no change in medication   Obesity Deteriorated. Patient re-educated about  the importance of commitment to a  minimum of 150 minutes of exercise per week.  The importance of healthy food choices with portion control discussed. Encouraged to start a food diary, count calories and to consider  joining a support group. Sample diet sheets offered. Goals set by the patient for the next several months.   Weight /BMI 06/27/2017 12/27/2016 07/31/2016  WEIGHT 203 lb 201 lb 170 lb  HEIGHT 5\' 3"  5\' 3"  5\' 3"   BMI 35.96 kg/m2 35.61 kg/m2 30.11 kg/m2      Tubular adenoma of colon Repeat colonoscopy due in 2019

## 2017-07-11 ENCOUNTER — Other Ambulatory Visit: Payer: Self-pay | Admitting: Family Medicine

## 2017-07-11 DIAGNOSIS — I10 Essential (primary) hypertension: Secondary | ICD-10-CM | POA: Diagnosis not present

## 2017-07-11 DIAGNOSIS — E559 Vitamin D deficiency, unspecified: Secondary | ICD-10-CM | POA: Diagnosis not present

## 2017-07-11 LAB — CBC
HEMATOCRIT: 34.7 % — AB (ref 35.0–45.0)
Hemoglobin: 10.6 g/dL — ABNORMAL LOW (ref 11.7–15.5)
MCH: 21.9 pg — ABNORMAL LOW (ref 27.0–33.0)
MCHC: 30.5 g/dL — ABNORMAL LOW (ref 32.0–36.0)
MCV: 71.5 fL — ABNORMAL LOW (ref 80.0–100.0)
MPV: 10.5 fL (ref 7.5–12.5)
PLATELETS: 276 10*3/uL (ref 140–400)
RBC: 4.85 MIL/uL (ref 3.80–5.10)
RDW: 15.1 % — AB (ref 11.0–15.0)
WBC: 5.5 10*3/uL (ref 3.8–10.8)

## 2017-07-11 LAB — BASIC METABOLIC PANEL
BUN: 13 mg/dL (ref 7–25)
CHLORIDE: 104 mmol/L (ref 98–110)
CO2: 24 mmol/L (ref 20–32)
Calcium: 9.1 mg/dL (ref 8.6–10.4)
Creat: 0.61 mg/dL (ref 0.50–1.05)
Glucose, Bld: 105 mg/dL — ABNORMAL HIGH (ref 65–99)
POTASSIUM: 4 mmol/L (ref 3.5–5.3)
Sodium: 138 mmol/L (ref 135–146)

## 2017-07-12 LAB — VITAMIN D 25 HYDROXY (VIT D DEFICIENCY, FRACTURES): Vit D, 25-Hydroxy: 28 ng/mL — ABNORMAL LOW (ref 30–100)

## 2017-07-16 LAB — IRON,TIBC AND FERRITIN PANEL
%SAT: 32 % (ref 11–50)
Ferritin: 39 ng/mL (ref 10–232)
Iron: 96 ug/dL (ref 45–160)
TIBC: 297 ug/dL (ref 250–450)

## 2017-07-16 LAB — HEMOGLOBIN A1C
Hgb A1c MFr Bld: 5.6 % (ref <5.7)
MEAN PLASMA GLUCOSE: 114 mg/dL

## 2017-07-16 LAB — B12 AND FOLATE PANEL
Folate: 13.9 ng/mL (ref >5.4)
VITAMIN B 12: 560 pg/mL (ref 200–1100)

## 2017-07-21 ENCOUNTER — Encounter: Payer: Self-pay | Admitting: Family Medicine

## 2017-10-04 ENCOUNTER — Ambulatory Visit (HOSPITAL_COMMUNITY)
Admission: RE | Admit: 2017-10-04 | Discharge: 2017-10-04 | Disposition: A | Discharge status: Home / Self Care | Payer: BLUE CROSS/BLUE SHIELD | Source: Ambulatory Visit | Attending: Family Medicine | Admitting: Family Medicine

## 2017-10-04 ENCOUNTER — Ambulatory Visit (INDEPENDENT_AMBULATORY_CARE_PROVIDER_SITE_OTHER): Payer: BLUE CROSS/BLUE SHIELD | Admitting: Family Medicine

## 2017-10-04 ENCOUNTER — Telehealth: Payer: Self-pay | Admitting: Family Medicine

## 2017-10-04 VITALS — BP 130/80 | HR 96 | Temp 97.9°F | Resp 16 | Ht 63.0 in | Wt 194.8 lb

## 2017-10-04 DIAGNOSIS — I10 Essential (primary) hypertension: Secondary | ICD-10-CM | POA: Diagnosis not present

## 2017-10-04 DIAGNOSIS — J209 Acute bronchitis, unspecified: Secondary | ICD-10-CM | POA: Diagnosis not present

## 2017-10-04 DIAGNOSIS — R918 Other nonspecific abnormal finding of lung field: Secondary | ICD-10-CM | POA: Insufficient documentation

## 2017-10-04 DIAGNOSIS — J40 Bronchitis, not specified as acute or chronic: Secondary | ICD-10-CM | POA: Diagnosis not present

## 2017-10-04 DIAGNOSIS — R05 Cough: Secondary | ICD-10-CM | POA: Insufficient documentation

## 2017-10-04 MED ORDER — AZITHROMYCIN 250 MG PO TABS: ORAL_TABLET | ORAL | 0 refills | Status: DC

## 2017-10-04 MED ORDER — BENZONATATE 100 MG PO CAPS: 100.0000 mg | ORAL_CAPSULE | Freq: Two times a day (BID) | ORAL | 0 refills | Status: DC | PRN

## 2017-10-04 MED ORDER — PREDNISONE 5 MG (21) PO TBPK: 5.0000 mg | ORAL_TABLET | ORAL | 0 refills | Status: DC

## 2017-10-04 NOTE — Patient Instructions (Addendum)
F/u as before  Call if you need me sooner  You are treated for acute bronchitis. Three medications are sent to your pharmacy  Work excuse to start on 10/31 , return to work Nov 2.  Please get CXR today , I will call with  Report   Acute Bronchitis, Adult Acute bronchitis is when air tubes (bronchi) in the lungs suddenly get swollen. The condition can make it hard to breathe. It can also cause these symptoms:  A cough.  Coughing up clear, yellow, or green mucus.  Wheezing.  Chest congestion.  Shortness of breath.  A fever.  Body aches.  Chills.  A sore throat.  Follow these instructions at home: Medicines  Take over-the-counter and prescription medicines only as told by your doctor.  If you were prescribed an antibiotic medicine, take it as told by your doctor. Do not stop taking the antibiotic even if you start to feel better. General instructions  Rest.  Drink enough fluids to keep your pee (urine) clear or pale yellow.  Avoid smoking and secondhand smoke. If you smoke and you need help quitting, ask your doctor. Quitting will help your lungs heal faster.  Use an inhaler, cool mist vaporizer, or humidifier as told by your doctor.  Keep all follow-up visits as told by your doctor. This is important. How is this prevented? To lower your risk of getting this condition again:  Wash your hands often with soap and water. If you cannot use soap and water, use hand sanitizer.  Avoid contact with people who have cold symptoms.  Try not to touch your hands to your mouth, nose, or eyes.  Make sure to get the flu shot every year.  Contact a doctor if:  Your symptoms do not get better in 2 weeks. Get help right away if:  You cough up blood.  You have chest pain.  You have very bad shortness of breath.  You become dehydrated.  You faint (pass out) or keep feeling like you are going to pass out.  You keep throwing up (vomiting).  You have a very bad  headache.  Your fever or chills gets worse. This information is not intended to replace advice given to you by your health care provider. Make sure you discuss any questions you have with your health care provider. Document Released: 05/10/2008 Document Revised: 06/30/2016 Document Reviewed: 05/12/2016 Elsevier Interactive Patient Education  2017 [employer].

## 2017-10-04 NOTE — Telephone Encounter (Signed)
Left msg for patient to call back, DrSimpsons schedule is very heavy, we were moving acute visits to another provider schedule with the permission from the patient.

## 2017-10-04 NOTE — Progress Notes (Signed)
   Kari Mason     MRN: [id number]      DOB: 1965-12-19   HPI Ms. Kari Mason is here 1 week h/o cough, no fever or chills, sputum is green and yellow , no sinus or upper respiiratory symptms. Symptoms statrted since moving into her new home, no one else is sick. Unab ROS  Denies chest pains, palpitations and leg swelling Denies abdominal pain, nausea, vomiting,diarrhea or constipation.   Denies dysuria, frequency, hesitancy or incontinence. Denies joint pain, swelling and limitation in mobility. Denies headaches, seizures, numbness, or tingling. Denies depression, anxiety or insomnia. Denies skin break down or rash.   PE  BP 130/80 (BP Location: Left Arm, Patient Position: Sitting, Cuff Size: Normal)   Pulse 96   Temp 97.9 F (36.6 C) (Other (Comment))   Resp 16   Ht 5\' 3"  (1.6 m)   Wt 194 lb 12 oz (88.3 kg)   SpO2 99%   BMI 34.50 kg/m   Patient alert and oriented and in no cardiopulmonary distress.Hoarse HEENT: No facial asymmetry, EOMI,   oropharynx pink and moist.  Neck supple no JVD, no mass.No sinus tenderness Chest: Clear to auscultation bilaterally.  CVS: S1, S2 no murmurs, no S3.Regular rate.  ABD: Soft non tender.   Ext: No edema  MS: Adequate ROM spine, shoulders, hips and knees.  Skin: Intact, no ulcerations or rash noted.  Psych: Good eye contact, normal affect. Memory intact not anxious or depressed appearing.  CNS: CN 2-12 intact, power,  normal throughout.no focal deficits noted.   Assessment & Plan  Acute bronchitis Decongestant, antibiotic and prednisone prescribed. CXR  Work excuse x 2 days  Essential hypertension Controlled, no change in medication DASH diet and commitment to daily physical activity for a minimum of 30 minutes discussed and encouraged, as a part of hypertension management. The importance of attaining a healthy weight is also discussed.  BP/Weight 10/04/2017 06/27/2017 12/27/2016 07/31/2016 05/21/2016 12/25/2015 65/46/5035    Systolic BP 465 681 275 170 017 494 496  Diastolic BP 80 78 84 83 82 80 82  Wt. (Lbs) 194.75 203 201 170 188.4 197 193  BMI 34.5 35.96 35.61 30.11 33.38 34.91 34.2       Obesity Improved. Pt applauded on succesful weight loss through lifestyle change, and encouraged to continue same. Weight loss goal set for the next several months.

## 2017-10-05 ENCOUNTER — Encounter: Payer: Self-pay | Admitting: Family Medicine

## 2017-10-05 DIAGNOSIS — J209 Acute bronchitis, unspecified: Secondary | ICD-10-CM | POA: Insufficient documentation

## 2017-10-05 NOTE — Assessment & Plan Note (Signed)
Decongestant, antibiotic and prednisone prescribed. CXR  Work excuse x 2 days

## 2017-10-05 NOTE — Assessment & Plan Note (Signed)
Controlled, no change in medication DASH diet and commitment to daily physical activity for a minimum of 30 minutes discussed and encouraged, as a part of hypertension management. The importance of attaining a healthy weight is also discussed.  BP/Weight 10/04/2017 06/27/2017 12/27/2016 07/31/2016 05/21/2016 12/25/2015 54/08/8118  Systolic BP 147 829 562 130 865 784 696  Diastolic BP 80 78 84 83 82 80 82  Wt. (Lbs) 194.75 203 201 170 188.4 197 193  BMI 34.5 35.96 35.61 30.11 33.38 34.91 34.2

## 2017-10-05 NOTE — Assessment & Plan Note (Signed)
Improved. Pt applauded on succesful weight loss through lifestyle change, and encouraged to continue same. Weight loss goal set for the next several months.  

## 2017-11-22 ENCOUNTER — Other Ambulatory Visit: Payer: Self-pay

## 2017-11-22 ENCOUNTER — Telehealth: Payer: Self-pay | Admitting: Family Medicine

## 2017-11-22 MED ORDER — LISINOPRIL-HYDROCHLOROTHIAZIDE 20-12.5 MG PO TABS: ORAL_TABLET | ORAL | 1 refills | Status: DC

## 2017-11-22 NOTE — Telephone Encounter (Signed)
Refills sent

## 2017-11-22 NOTE — Telephone Encounter (Signed)
Patient needs blood pressure pills called in to [facility]

## 2017-12-29 ENCOUNTER — Encounter: Payer: BLUE CROSS/BLUE SHIELD | Admitting: Family Medicine

## 2018-02-15 ENCOUNTER — Ambulatory Visit (INDEPENDENT_AMBULATORY_CARE_PROVIDER_SITE_OTHER): Payer: BLUE CROSS/BLUE SHIELD | Admitting: Family Medicine

## 2018-02-15 ENCOUNTER — Encounter: Payer: Self-pay | Admitting: Family Medicine

## 2018-02-15 VITALS — BP 118/82 | HR 92 | Resp 16 | Ht 63.0 in | Wt 190.0 lb

## 2018-02-15 DIAGNOSIS — Z1231 Encounter for screening mammogram for malignant neoplasm of breast: Secondary | ICD-10-CM | POA: Diagnosis not present

## 2018-02-15 DIAGNOSIS — Z0000 Encounter for general adult medical examination without abnormal findings: Secondary | ICD-10-CM

## 2018-02-15 NOTE — Patient Instructions (Signed)
F/u early September, call if you need me before  Please get mammogram scheduled at [facility] before you leave  Please return 3 stool cards for colon screening  Pls work on change in food choice and regular exercise as we discussed  No changes in medication  CBC, fasting lipid, chem 7 , HBA1C,mid August  ,Thank you  for choosing [facility]. We consider it a privelige to serve you.  Delivering excellent health care in a caring and  compassionate way is our goal.  Partnering with you,  so that together we can achieve this goal is our strategy.

## 2018-02-16 ENCOUNTER — Other Ambulatory Visit: Payer: Self-pay | Admitting: Family Medicine

## 2018-02-16 DIAGNOSIS — Z1231 Encounter for screening mammogram for malignant neoplasm of breast: Secondary | ICD-10-CM

## 2018-02-18 ENCOUNTER — Encounter: Payer: Self-pay | Admitting: Family Medicine

## 2018-02-18 NOTE — Progress Notes (Signed)
    TANYLAH SCHNOEBELEN     MRN: [id number]      DOB: 1966-01-30  HPI: Patient is in for annual physical exam. No other health concerns are expressed or addressed at the visit.    PE: BP 118/82   Pulse 92   Resp 16   Ht 5\' 3"  (1.6 m)   Wt 190 lb (86.2 kg)   LMP 02/10/2018   SpO2 98%   BMI 33.66 kg/m   Pleasant  female, alert and oriented x 3, in no cardio-pulmonary distress. Afebrile. HEENT No facial trauma or asymetry. Sinuses non tender.  Extra occullar muscles intact,. External ears normal, tympanic membranes clear. Oropharynx moist, no exudate. Neck: supple, no adenopathy,JVD or thyromegaly.No bruits.  Chest: Clear to ascultation bilaterally.No crackles or wheezes. Non tender to palpation  Breast: No asymetry,no masses or lumps. No tenderness. No nipple discharge or inversion. No axillary or supraclavicular adenopathy  Cardiovascular system; Heart sounds normal,  S1 and  S2 ,no S3.  No murmur, or thrill. Apical beat not displaced Peripheral pulses normal.  Abdomen: Soft, non tender, no organomegaly or masses. No bruits. Bowel sounds normal. No guarding, tenderness or rebound.  Rectal:  Deferred Pt to return 3 stool cards.  GU: Asymptomatic, not examined   Musculoskeletal exam: Full ROM of spine, hips , shoulders and knees. No deformity ,swelling or crepitus noted. No muscle wasting or atrophy.   Neurologic: Cranial nerves 2 to 12 intact. Power, tone ,sensation and reflexes normal throughout. No disturbance in gait. No tremor.  Skin: Intact, no ulceration, erythema , scaling or rash noted. Pigmentation normal throughout  Psych; Normal mood and affect. Judgement and concentration normal   Assessment & Plan:  Annual physical exam Annual exam as documented. Counseling done  re healthy lifestyle involving commitment to 150 minutes exercise per week, heart healthy diet, and attaining healthy weight.The importance of adequate sleep also  discussed. Regular seat belt use and home safety, is also discussed. Changes in health habits are decided on by the patient with goals and time frames  set for achieving them. Immunization and cancer screening needs are specifically addressed at this visit.

## 2018-02-18 NOTE — Assessment & Plan Note (Signed)

## 2018-03-07 ENCOUNTER — Ambulatory Visit
Admission: RE | Admit: 2018-03-07 | Discharge: 2018-03-07 | Disposition: A | Discharge status: Home / Self Care | Payer: BLUE CROSS/BLUE SHIELD | Source: Ambulatory Visit | Attending: Family Medicine | Admitting: Family Medicine

## 2018-03-07 DIAGNOSIS — Z1231 Encounter for screening mammogram for malignant neoplasm of breast: Secondary | ICD-10-CM

## 2018-03-08 ENCOUNTER — Other Ambulatory Visit: Payer: Self-pay

## 2018-03-08 ENCOUNTER — Ambulatory Visit (INDEPENDENT_AMBULATORY_CARE_PROVIDER_SITE_OTHER): Payer: BLUE CROSS/BLUE SHIELD | Admitting: Family Medicine

## 2018-03-08 ENCOUNTER — Encounter: Payer: Self-pay | Admitting: Family Medicine

## 2018-03-08 VITALS — BP 108/58 | HR 82 | Temp 98.4°F | Ht 63.0 in | Wt 193.1 lb

## 2018-03-08 DIAGNOSIS — J069 Acute upper respiratory infection, unspecified: Secondary | ICD-10-CM | POA: Diagnosis not present

## 2018-03-08 MED ORDER — FLUTICASONE PROPIONATE 50 MCG/ACT NA SUSP: 2.0000 | Freq: Every day | NASAL | 0 refills | Status: DC

## 2018-03-08 MED ORDER — PREDNISONE 20 MG PO TABS: 20.0000 mg | ORAL_TABLET | Freq: Two times a day (BID) | ORAL | 0 refills | Status: DC

## 2018-03-08 NOTE — Patient Instructions (Addendum)
You have a viral respiratory infection Antibiotics will nt help you get better I am giving you a nasal spray and some prednisone to reduce the symptoms Drink plenty of water Tylenol or ibuprofen for pain and fever  Call if not better in a few days  Work note

## 2018-03-08 NOTE — Progress Notes (Signed)
    Chief Complaint  Patient presents with  . Chest Pain  . feels weak   Patient is here for the sudden onset of runny stuffy nose, scratchy sore throat, chest pain, body aches, and chills.  She felt well yesterday, and then today started to feel sick.  No known exposure to influenza.  No coughing or chest congestion.  No nausea or vomiting.  No fever noted.  Patient Active Problem List   Diagnosis Date Noted  . Verruca vulgaris 12/27/2016  . Annual physical exam 12/28/2015  . Tubular adenoma of colon 10/17/2015  . Obesity 07/14/2015  . GERD (gastroesophageal reflux disease) 04/23/2014  . IGT (impaired glucose tolerance) 04/16/2012  . Essential hypertension 12/19/2007  . Allergic rhinitis 12/19/2007    Outpatient Encounter Medications as of 03/08/2018  Medication Sig  . lisinopril-hydrochlorothiazide (PRINZIDE,ZESTORETIC) 20-12.5 MG tablet TAKE ONE & ONE-HALF TABLETS BY MOUTH ONCE DAILY  . fluticasone (FLONASE) 50 MCG/ACT nasal spray Place 2 sprays into both nostrils daily.  . predniSONE (DELTASONE) 20 MG tablet Take 1 tablet (20 mg total) by mouth 2 (two) times daily with a meal.   No facility-administered encounter medications on file as of 03/08/2018.     No Known Allergies  Review of Systems  Constitutional: Positive for fatigue. Negative for activity change, appetite change and unexpected weight change.  HENT: Positive for congestion, postnasal drip, rhinorrhea and sore throat. Negative for dental problem.   Eyes: Negative for redness and visual disturbance.  Respiratory: Positive for chest tightness. Negative for cough and shortness of breath.   Cardiovascular: Negative for chest pain, palpitations and leg swelling.  Gastrointestinal: Negative for abdominal pain, diarrhea, nausea and vomiting.  Genitourinary: Negative for difficulty urinating, frequency and menstrual problem.  Musculoskeletal: Positive for myalgias. Negative for arthralgias and back pain.  Neurological:  Positive for light-headedness and headaches. Negative for dizziness.  Psychiatric/Behavioral: Negative for dysphoric mood and sleep disturbance. The patient is not nervous/anxious.     BP (!) 108/58   Pulse 82   Temp 98.4 F (36.9 C) (Oral)   Ht 5\' 3"  (1.6 m)   Wt 193 lb 1.9 oz (87.6 kg)   LMP 02/10/2018   SpO2 98%   BMI 34.21 kg/m   Physical Exam  Constitutional: She is oriented to person, place, and time. She appears well-developed and well-nourished. She appears ill.  HENT:  Head: Normocephalic and atraumatic.  Right Ear: Tympanic membrane and ear canal normal.  Left Ear: Tympanic membrane and ear canal normal.  Mouth/Throat: Posterior oropharyngeal erythema present. No oropharyngeal exudate.  Eyes: Pupils are equal, round, and reactive to light. EOM are normal.  Neck: Normal range of motion.  Cardiovascular: Normal rate and regular rhythm.  Pulmonary/Chest: Effort normal and breath sounds normal.  Abdominal: Soft. Bowel sounds are normal. There is no tenderness.  Lymphadenopathy:    She has no cervical adenopathy.  Neurological: She is alert and oriented to person, place, and time.  Skin: Skin is warm and dry.  Psychiatric: She has a normal mood and affect. Her behavior is normal.    ASSESSMENT/PLAN:  1. Viral URI Influenza screen negative   Patient Instructions  You have a viral respiratory infection Antibiotics will nt help you get better I am giving you a nasal spray and some prednisone to reduce the symptoms Drink plenty of water Tylenol or ibuprofen for pain and fever  Call if not better in a few days  Work note   Raylene Everts, MD

## 2018-04-26 ENCOUNTER — Telehealth: Payer: Self-pay | Admitting: Family Medicine

## 2018-04-26 ENCOUNTER — Other Ambulatory Visit: Payer: Self-pay

## 2018-04-26 MED ORDER — LISINOPRIL-HYDROCHLOROTHIAZIDE 20-12.5 MG PO TABS: ORAL_TABLET | ORAL | 1 refills | Status: DC

## 2018-04-26 NOTE — Telephone Encounter (Signed)
refilled 

## 2018-04-26 NOTE — Telephone Encounter (Signed)
Pt needs BP called in ---lisinopril , please call to [facility] in [place]

## 2018-08-08 ENCOUNTER — Encounter: Payer: Self-pay | Admitting: Family Medicine

## 2018-08-08 ENCOUNTER — Other Ambulatory Visit: Payer: Self-pay

## 2018-08-08 ENCOUNTER — Ambulatory Visit: Payer: BLUE CROSS/BLUE SHIELD | Admitting: Family Medicine

## 2018-08-08 VITALS — BP 118/72 | HR 86 | Resp 12 | Ht 63.0 in | Wt 199.1 lb

## 2018-08-08 DIAGNOSIS — D126 Benign neoplasm of colon, unspecified: Secondary | ICD-10-CM | POA: Diagnosis not present

## 2018-08-08 DIAGNOSIS — R7302 Impaired glucose tolerance (oral): Secondary | ICD-10-CM | POA: Diagnosis not present

## 2018-08-08 DIAGNOSIS — I10 Essential (primary) hypertension: Secondary | ICD-10-CM | POA: Diagnosis not present

## 2018-08-08 DIAGNOSIS — E559 Vitamin D deficiency, unspecified: Secondary | ICD-10-CM

## 2018-08-08 DIAGNOSIS — Z6835 Body mass index (BMI) 35.0-35.9, adult: Secondary | ICD-10-CM

## 2018-08-08 NOTE — Assessment & Plan Note (Signed)
colonscopy due in October, she  Is referred back

## 2018-08-08 NOTE — Assessment & Plan Note (Addendum)
Deteriorated.Plans to start personal trainer Patient re-educated about  the importance of commitment to a  minimum of 150 minutes of exercise per week.  The importance of healthy food choices with portion control discussed. Encouraged to start a food diary, count calories and to consider  joining a support group. Sample diet sheets offered.Will start 1500 cal diet Goals set by the patient for the next several months.   Weight /BMI 08/08/2018 03/08/2018 02/15/2018  WEIGHT 199 lb 1.9 oz 193 lb 1.9 oz 190 lb  HEIGHT 5\' 3"  5\' 3"  5\' 3"   BMI 35.27 kg/m2 34.21 kg/m2 33.66 kg/m2

## 2018-08-08 NOTE — Patient Instructions (Signed)
F/U in 4 months, call if you need me ebfore  Weight loss goal of 4 pounds per month, you can do this  Please follow the 1500 cal plan  It is important that you exercise regularly at least 45 minutes 5 times a week. If you develop chest pain, have severe difficulty breathing, or feel very tired, stop exercising immediately and seek medical attention     CBC, lipid, chem 7 and EGFR, TSH and vitamin  D this week  Friday , fasting ( [facility])    You need your rept colonoscopy this Fall a referral has been sent to Dr Laural Golden   Please commit to once daily calcium wit Vit D  Thank you  for choosing [facility]. We consider it a privelige to serve you.  Delivering excellent health care in a caring and  compassionate way is our goal.  Partnering with you,  so that together we can achieve this goal is our strategy.

## 2018-08-08 NOTE — Addendum Note (Signed)
Addended by: Eual Fines on: 08/08/2018 08:47 AM   Modules accepted: Orders

## 2018-08-08 NOTE — Assessment & Plan Note (Signed)
Updated lab needed at/ before next visit. Patient educated about the importance of limiting  Carbohydrate intake , the need to commit to daily physical activity for a minimum of 30 minutes , and to commit weight loss. The fact that changes in all these areas will reduce or eliminate all together the development of diabetes is stressed.   Diabetic Labs Latest Ref Rng & Units 07/11/2017 12/27/2016 05/21/2016 10/23/2014 04/26/2014  HbA1c <5.7 % 5.6 5.4 5.5 5.8(H) 5.9(H)  Chol <200 mg/dL - 168 198 - 170  HDL >50 mg/dL - 62 76 - 64  Calc LDL <100 mg/dL - 92 110 - 94  Triglycerides <150 mg/dL - 70 60 - 60  Creatinine 0.50 - 1.05 mg/dL 0.61 0.60 0.62 0.72 0.59   BP/Weight 08/08/2018 03/08/2018 02/15/2018 10/04/2017 06/27/2017 12/27/2016 4/92/0100  Systolic BP 712 197 588 325 498 264 158  Diastolic BP 72 58 82 80 78 84 83  Wt. (Lbs) 199.12 193.12 190 194.75 203 201 170  BMI 35.27 34.21 33.66 34.5 35.96 35.61 30.11   No flowsheet data found.

## 2018-08-08 NOTE — Progress Notes (Signed)
Kari Mason     MRN: [id number]      DOB: [birth year]   HPI Kari Mason is here for follow up and re-evaluation of chronic medical conditions, medication management and review of any available recent lab and radiology data.  Preventive health is updated, specifically  Cancer screening and Immunization.   Questions or concerns regarding consultations or procedures which the PT has had in the interim are  addressed. The PT denies any adverse reactions to current medications since the last visit.  There are no new concerns.  There are no specific complaints   ROS Denies recent fever or chills. Denies sinus pressure, nasal congestion, ear pain or sore throat. Denies chest congestion, productive cough or wheezing. Denies chest pains, palpitations and leg swelling Denies abdominal pain, nausea, vomiting,diarrhea or constipation.   Denies dysuria, frequency, hesitancy or incontinence. Denies joint pain, swelling and limitation in mobility. Denies headaches, seizures, numbness, or tingling. Denies depression, anxiety or insomnia. Denies skin break down or rash.   PE  BP 118/72 (BP Location: Left Arm, Patient Position: Sitting, Cuff Size: Large)   Pulse 86   Resp 12   Ht 5\' 3"  (1.6 m)   Wt 199 lb 1.9 oz (90.3 kg)   SpO2 98% Comment: room air  BMI 35.27 kg/m   Patient alert and oriented and in no cardiopulmonary distress.  HEENT: No facial asymmetry, EOMI,   oropharynx pink and moist.  Neck supple no JVD, no mass.  Chest: Clear to auscultation bilaterally.  CVS: S1, S2 no murmurs, no S3.Regular rate.  ABD: Soft non tender.   Ext: No edema  MS: Adequate ROM spine, shoulders, hips and knees.  Skin: Intact, no ulcerations or rash noted.  Psych: Good eye contact, normal affect. Memory intact not anxious or depressed appearing.  CNS: CN 2-12 intact, power,  normal throughout.no focal deficits noted.   Assessment & Plan  Essential hypertension Controlled, no change in  medication DASH diet and commitment to daily physical activity for a minimum of 30 minutes discussed and encouraged, as a part of hypertension management. The importance of attaining a healthy weight is also discussed.  BP/Weight 08/08/2018 03/08/2018 02/15/2018 10/04/2017 06/27/2017 12/27/2016 3/79/0240  Systolic BP 973 532 992 426 834 196 222  Diastolic BP 72 58 82 80 78 84 83  Wt. (Lbs) 199.12 193.12 190 194.75 203 201 170  BMI 35.27 34.21 33.66 34.5 35.96 35.61 30.11       Obesity Deteriorated.Plans to start personal trainer Patient re-educated about  the importance of commitment to a  minimum of 150 minutes of exercise per week.  The importance of healthy food choices with portion control discussed. Encouraged to start a food diary, count calories and to consider  joining a support group. Sample diet sheets offered.Will start 1500 cal diet Goals set by the patient for the next several months.   Weight /BMI 08/08/2018 03/08/2018 02/15/2018  WEIGHT 199 lb 1.9 oz 193 lb 1.9 oz 190 lb  HEIGHT 5\' 3"  5\' 3"  5\' 3"   BMI 35.27 kg/m2 34.21 kg/m2 33.66 kg/m2      IGT (impaired glucose tolerance) Updated lab needed at/ before next visit. Patient educated about the importance of limiting  Carbohydrate intake , the need to commit to daily physical activity for a minimum of 30 minutes , and to commit weight loss. The fact that changes in all these areas will reduce or eliminate all together the development of diabetes is stressed.   Diabetic Labs Latest  Ref Rng & Units 07/11/2017 12/27/2016 05/21/2016 10/23/2014 04/26/2014  HbA1c <5.7 % 5.6 5.4 5.5 5.8(H) 5.9(H)  Chol <200 mg/dL - 168 198 - 170  HDL >50 mg/dL - 62 76 - 64  Calc LDL <100 mg/dL - 92 110 - 94  Triglycerides <150 mg/dL - 70 60 - 60  Creatinine 0.50 - 1.05 mg/dL 0.61 0.60 0.62 0.72 0.59   BP/Weight 08/08/2018 03/08/2018 02/15/2018 10/04/2017 06/27/2017 12/27/2016 1/44/3154  Systolic BP 008 676 195 093 267 124 580  Diastolic BP 72 58 82 80 78 84  83  Wt. (Lbs) 199.12 193.12 190 194.75 203 201 170  BMI 35.27 34.21 33.66 34.5 35.96 35.61 30.11   No flowsheet data found.    Tubular adenoma of colon colonscopy due in October, she  Is referred back

## 2018-08-08 NOTE — Assessment & Plan Note (Signed)
Controlled, no change in medication DASH diet and commitment to daily physical activity for a minimum of 30 minutes discussed and encouraged, as a part of hypertension management. The importance of attaining a healthy weight is also discussed.  BP/Weight 08/08/2018 03/08/2018 02/15/2018 10/04/2017 06/27/2017 12/27/2016 3/57/0177  Systolic BP 939 030 092 330 076 226 333  Diastolic BP 72 58 82 80 78 84 83  Wt. (Lbs) 199.12 193.12 190 194.75 203 201 170  BMI 35.27 34.21 33.66 34.5 35.96 35.61 30.11

## 2018-08-11 ENCOUNTER — Encounter (INDEPENDENT_AMBULATORY_CARE_PROVIDER_SITE_OTHER): Payer: Self-pay | Admitting: *Deleted

## 2018-08-11 DIAGNOSIS — I10 Essential (primary) hypertension: Secondary | ICD-10-CM | POA: Diagnosis not present

## 2018-08-11 DIAGNOSIS — E559 Vitamin D deficiency, unspecified: Secondary | ICD-10-CM | POA: Diagnosis not present

## 2018-08-12 LAB — BASIC METABOLIC PANEL WITH GFR
BUN: 15 mg/dL (ref 7–25)
CHLORIDE: 107 mmol/L (ref 98–110)
CO2: 26 mmol/L (ref 20–32)
CREATININE: 0.68 mg/dL (ref 0.50–1.05)
Calcium: 9 mg/dL (ref 8.6–10.4)
GFR, Est African American: 116 mL/min/{1.73_m2} (ref >=60)
GFR, Est Non African American: 100 mL/min/{1.73_m2} (ref >=60)
GLUCOSE: 103 mg/dL — AB (ref 65–99)
POTASSIUM: 4.1 mmol/L (ref 3.5–5.3)
SODIUM: 141 mmol/L (ref 135–146)

## 2018-08-12 LAB — CBC
HCT: 32.5 % — ABNORMAL LOW (ref 35.0–45.0)
Hemoglobin: 10 g/dL — ABNORMAL LOW (ref 11.7–15.5)
MCH: 21.9 pg — ABNORMAL LOW (ref 27.0–33.0)
MCHC: 30.8 g/dL — ABNORMAL LOW (ref 32.0–36.0)
MCV: 71.3 fL — AB (ref 80.0–100.0)
MPV: 13 fL — ABNORMAL HIGH (ref 7.5–12.5)
Platelets: 267 10*3/uL (ref 140–400)
RBC: 4.56 10*6/uL (ref 3.80–5.10)
RDW: 14.4 % (ref 11.0–15.0)
WBC: 6.1 10*3/uL (ref 3.8–10.8)

## 2018-08-12 LAB — VITAMIN D 25 HYDROXY (VIT D DEFICIENCY, FRACTURES): Vit D, 25-Hydroxy: 23 ng/mL — ABNORMAL LOW (ref 30–100)

## 2018-08-12 LAB — LIPID PANEL
CHOL/HDL RATIO: 2.6 (calc) (ref <5.0)
Cholesterol: 156 mg/dL (ref <200)
HDL: 59 mg/dL (ref >50)
LDL CHOLESTEROL (CALC): 83 mg/dL
NON-HDL CHOLESTEROL (CALC): 97 mg/dL (ref <130)
TRIGLYCERIDES: 62 mg/dL (ref <150)

## 2018-08-12 LAB — TSH: TSH: 1.78 m[IU]/L

## 2018-08-22 ENCOUNTER — Ambulatory Visit (INDEPENDENT_AMBULATORY_CARE_PROVIDER_SITE_OTHER): Payer: BLUE CROSS/BLUE SHIELD | Admitting: Internal Medicine

## 2018-08-22 ENCOUNTER — Encounter (INDEPENDENT_AMBULATORY_CARE_PROVIDER_SITE_OTHER): Payer: Self-pay | Admitting: Internal Medicine

## 2018-08-22 VITALS — BP 150/90 | HR 64 | Temp 97.8°F | Ht 63.0 in | Wt 195.0 lb

## 2018-08-22 DIAGNOSIS — D508 Other iron deficiency anemias: Secondary | ICD-10-CM

## 2018-08-22 DIAGNOSIS — D369 Benign neoplasm, unspecified site: Secondary | ICD-10-CM

## 2018-08-22 NOTE — Patient Instructions (Signed)
The risks of bleeding, perforation and infection were reviewed with patient.  

## 2018-08-22 NOTE — Progress Notes (Signed)
   Subjective:    Patient ID: Kari Mason, female    DOB: March 02, 1966, 52 y.o.   MRN: [id number]  HPI Referred by Dr. Moshe Cipro follow up her polyps. She is not having any GI problems. BMs are normal. No melena or BRRB. No change in her stools.  Appetite is good. No weight loss.  No abdominal pain. No family hx of colon cancer.  No GI complaints.  Hx of large tubular adenoma colon. She is due for a colonoscopy. Recent CBC showed Hemoglobin of 10. Iron studies were normal. Patient is taking replacement iron.   Iron/TIBC/Ferritin/ %Sat    Component Value Date/Time   IRON 96 07/11/2017 1022   TIBC 297 07/11/2017 1022   FERRITIN 39 07/11/2017 1022   IRONPCTSAT 32 07/11/2017 1022      CBC    Component Value Date/Time   WBC 6.1 08/11/2018 0726   RBC 4.56 08/11/2018 0726   HGB 10.0 (L) 08/11/2018 0726   HCT 32.5 (L) 08/11/2018 0726   PLT 267 08/11/2018 0726   MCV 71.3 (L) 08/11/2018 0726   MCH 21.9 (L) 08/11/2018 0726   MCHC 30.8 (L) 08/11/2018 0726   RDW 14.4 08/11/2018 0726   LYMPHSABS 2.2 04/18/2013 1105   MONOABS 0.3 04/18/2013 1105   EOSABS 0.2 04/18/2013 1105   BASOSABS 0.0 04/18/2013 1105     Her last colonoscopy was in November of 2016 with snare polypectomy and clip application to polypectomy.  Impression:  Examination performed to cecum. Small cecal polyp was cold snared. 20 mm sigmoid colon hot snared. Two 360 clips to polypectomy site for hemostasis. External hemorrhoids Biopsy: both polyps are tubular adenoma. Next colonoscopy 3 yrs.     Review of Systems Past Medical History:  Diagnosis Date  . Allergy   . GERD (gastroesophageal reflux disease)   . Hypertension     Past Surgical History:  Procedure Laterality Date  . COLONOSCOPY N/A 10/15/2015   Procedure: COLONOSCOPY;  Surgeon: Rogene Houston, MD;  Location: [facility];  Service: Endoscopy;  Laterality: N/A;  1200  . TUBAL LIGATION      No Known Allergies  Current Outpatient Medications  on File Prior to Visit  Medication Sig Dispense Refill  . lisinopril-hydrochlorothiazide (PRINZIDE,ZESTORETIC) 20-12.5 MG tablet TAKE ONE & ONE-HALF TABLETS BY MOUTH ONCE DAILY 135 tablet 1   No current facility-administered medications on file prior to visit.         Objective:   Physical Exam Blood pressure (!) 150/90, pulse 64, temperature 97.8 F (36.6 C), height 5\' 3"  (1.6 m), weight 195 lb (88.5 kg).   Alert and oriented. Skin warm and dry. Oral mucosa is moist.   . Sclera anicteric, conjunctivae is pink. Thyroid not enlarged. No cervical lymphadenopathy. Lungs clear. Heart regular rate and rhythm.  Abdomen is soft. Bowel sounds are positive. No hepatomegaly. No abdominal masses felt. No tenderness.  No edema to lower extremities.        Assessment & Plan:  Large Tubular adenoma; Needs surveillance colonoscopy.  IDA: Iron studies. If iron studies are low, she will need a colonoscopy and EGD. She will have labs drawn in 2 weeks.

## 2018-09-05 ENCOUNTER — Ambulatory Visit (INDEPENDENT_AMBULATORY_CARE_PROVIDER_SITE_OTHER): Payer: BLUE CROSS/BLUE SHIELD | Admitting: Internal Medicine

## 2018-09-05 ENCOUNTER — Other Ambulatory Visit (INDEPENDENT_AMBULATORY_CARE_PROVIDER_SITE_OTHER): Payer: Self-pay | Admitting: *Deleted

## 2018-09-07 ENCOUNTER — Other Ambulatory Visit (INDEPENDENT_AMBULATORY_CARE_PROVIDER_SITE_OTHER): Payer: Self-pay | Admitting: *Deleted

## 2018-09-07 DIAGNOSIS — D649 Anemia, unspecified: Secondary | ICD-10-CM

## 2018-09-07 NOTE — Progress Notes (Signed)
Lab test has been ordered 

## 2018-09-14 ENCOUNTER — Other Ambulatory Visit (INDEPENDENT_AMBULATORY_CARE_PROVIDER_SITE_OTHER): Payer: Self-pay | Admitting: *Deleted

## 2018-09-14 DIAGNOSIS — D508 Other iron deficiency anemias: Secondary | ICD-10-CM | POA: Diagnosis not present

## 2018-09-14 LAB — HEMOGLOBIN AND HEMATOCRIT, BLOOD
HEMATOCRIT: 37.5 % (ref 35.0–45.0)
HEMOGLOBIN: 11.3 g/dL — AB (ref 11.7–15.5)

## 2018-09-14 LAB — IRON, TOTAL/TOTAL IRON BINDING CAP
%SAT: 29 % (calc) (ref 16–45)
Iron: 86 ug/dL (ref 45–160)
TIBC: 296 mcg/dL (calc) (ref 250–450)

## 2018-09-14 LAB — FERRITIN: FERRITIN: 42 ng/mL (ref 16–232)

## 2018-09-18 ENCOUNTER — Telehealth (INDEPENDENT_AMBULATORY_CARE_PROVIDER_SITE_OTHER): Payer: Self-pay | Admitting: Internal Medicine

## 2018-09-18 ENCOUNTER — Other Ambulatory Visit (INDEPENDENT_AMBULATORY_CARE_PROVIDER_SITE_OTHER): Payer: Self-pay | Admitting: Internal Medicine

## 2018-09-18 DIAGNOSIS — Z86010 Personal history of colonic polyps: Secondary | ICD-10-CM

## 2018-09-18 NOTE — Telephone Encounter (Signed)
I have spoken with patient.  Colonoscopy ordered

## 2018-09-18 NOTE — Telephone Encounter (Signed)
err

## 2018-09-18 NOTE — Telephone Encounter (Signed)
Patient called wanted to know if she is to have another procedure - phone# [phone]

## 2018-09-18 NOTE — Telephone Encounter (Signed)
Ann, Colonoscopy 

## 2018-09-19 ENCOUNTER — Telehealth (INDEPENDENT_AMBULATORY_CARE_PROVIDER_SITE_OTHER): Payer: Self-pay | Admitting: *Deleted

## 2018-09-19 ENCOUNTER — Encounter (INDEPENDENT_AMBULATORY_CARE_PROVIDER_SITE_OTHER): Payer: Self-pay | Admitting: *Deleted

## 2018-09-19 DIAGNOSIS — Z86010 Personal history of colonic polyps: Secondary | ICD-10-CM | POA: Insufficient documentation

## 2018-09-19 NOTE — Telephone Encounter (Signed)
Patient needs suprep 

## 2018-09-19 NOTE — Telephone Encounter (Signed)
TCS sch'd 12/07/18 at 145 (1245), patient aware, instructions mailed

## 2018-09-20 MED ORDER — SUPREP BOWEL PREP KIT 17.5-3.13-1.6 GM/177ML PO SOLN: 1.0000 | Freq: Once | ORAL | 0 refills | Status: AC

## 2018-11-17 DIAGNOSIS — J069 Acute upper respiratory infection, unspecified: Secondary | ICD-10-CM | POA: Diagnosis not present

## 2018-11-17 DIAGNOSIS — J209 Acute bronchitis, unspecified: Secondary | ICD-10-CM | POA: Diagnosis not present

## 2018-11-17 DIAGNOSIS — J329 Chronic sinusitis, unspecified: Secondary | ICD-10-CM | POA: Diagnosis not present

## 2018-12-07 ENCOUNTER — Encounter (HOSPITAL_COMMUNITY): Payer: Self-pay | Admitting: *Deleted

## 2018-12-07 ENCOUNTER — Ambulatory Visit (HOSPITAL_COMMUNITY)
Admission: RE | Admit: 2018-12-07 | Discharge: 2018-12-07 | Disposition: A | Discharge status: Home / Self Care | Payer: BLUE CROSS/BLUE SHIELD | Attending: Internal Medicine | Admitting: Internal Medicine

## 2018-12-07 ENCOUNTER — Other Ambulatory Visit: Payer: Self-pay

## 2018-12-07 ENCOUNTER — Encounter (HOSPITAL_COMMUNITY)
Admission: RE | Disposition: A | Discharge status: Home / Self Care | Payer: Self-pay | Source: Home / Self Care | Attending: Internal Medicine

## 2018-12-07 DIAGNOSIS — Z860100 Personal history of colon polyps, unspecified: Secondary | ICD-10-CM | POA: Insufficient documentation

## 2018-12-07 DIAGNOSIS — Z1211 Encounter for screening for malignant neoplasm of colon: Secondary | ICD-10-CM | POA: Insufficient documentation

## 2018-12-07 DIAGNOSIS — Z79899 Other long term (current) drug therapy: Secondary | ICD-10-CM | POA: Diagnosis not present

## 2018-12-07 DIAGNOSIS — I10 Essential (primary) hypertension: Secondary | ICD-10-CM | POA: Diagnosis not present

## 2018-12-07 DIAGNOSIS — K219 Gastro-esophageal reflux disease without esophagitis: Secondary | ICD-10-CM | POA: Insufficient documentation

## 2018-12-07 DIAGNOSIS — K644 Residual hemorrhoidal skin tags: Secondary | ICD-10-CM | POA: Diagnosis not present

## 2018-12-07 DIAGNOSIS — Z09 Encounter for follow-up examination after completed treatment for conditions other than malignant neoplasm: Secondary | ICD-10-CM | POA: Diagnosis not present

## 2018-12-07 DIAGNOSIS — K6289 Other specified diseases of anus and rectum: Secondary | ICD-10-CM | POA: Diagnosis not present

## 2018-12-07 HISTORY — PX: COLONOSCOPY: SHX5424

## 2018-12-07 SURGERY — COLONOSCOPY
Anesthesia: Moderate Sedation

## 2018-12-07 MED ORDER — MIDAZOLAM HCL 5 MG/5ML IJ SOLN
INTRAMUSCULAR | Status: DC | PRN
  Administered 2018-12-07 (×2): 1 mg via INTRAVENOUS
  Administered 2018-12-07 (×2): 2 mg via INTRAVENOUS

## 2018-12-07 MED ORDER — SODIUM CHLORIDE 0.9 % IV SOLN
INTRAVENOUS | Status: DC
  Administered 2018-12-07: 1000 mL via INTRAVENOUS

## 2018-12-07 MED ORDER — MEPERIDINE HCL 50 MG/ML IJ SOLN
INTRAMUSCULAR | Status: AC
  Filled 2018-12-07: qty 1

## 2018-12-07 MED ORDER — MEPERIDINE HCL 50 MG/ML IJ SOLN
INTRAMUSCULAR | Status: DC | PRN
  Administered 2018-12-07 (×2): 25 mg

## 2018-12-07 MED ORDER — STERILE WATER FOR IRRIGATION IR SOLN
Status: DC | PRN
  Administered 2018-12-07: 1.5 mL

## 2018-12-07 MED ORDER — MIDAZOLAM HCL 5 MG/5ML IJ SOLN
INTRAMUSCULAR | Status: AC
  Filled 2018-12-07: qty 10

## 2018-12-07 NOTE — Op Note (Signed)
[facility] Patient Name: Kari Mason Procedure Date: 12/07/2018 2:09 PM MRN: [id number] Date of Birth: 26-Jul-1966 Attending MD: Hildred Laser , MD CSN: [id number] Age: 53 Admit Type: Outpatient Procedure:                Colonoscopy Indications:              High risk colon cancer surveillance: Personal                            history of colonic polyps Providers:                Hildred Laser, MD, Gerome Sam, RN, Aram Candela Referring MD:             Norwood Levo. Moshe Cipro, MD Medicines:                Meperidine 50 mg IV, Midazolam 6 mg IV Complications:            No immediate complications. Estimated Blood Loss:     Estimated blood loss: none. Procedure:                Pre-Anesthesia Assessment:                           - Prior to the procedure, a History and Physical                            was performed, and patient medications and                            allergies were reviewed. The patient's tolerance of                            previous anesthesia was also reviewed. The risks                            and benefits of the procedure and the sedation                            options and risks were discussed with the patient.                            All questions were answered, and informed consent                            was obtained. Prior Anticoagulants: The patient has                            taken no previous anticoagulant or antiplatelet                            agents. ASA Grade Assessment: II - A patient with                            mild systemic disease. After reviewing the risks  and benefits, the patient was deemed in                            satisfactory condition to undergo the procedure.                           After obtaining informed consent, the colonoscope                            was passed under direct vision. Throughout the                            procedure, the patient's blood pressure,  pulse, and                            oxygen saturations were monitored continuously. The                            PCF-[id number] ([id number]) was introduced through the                            anus and advanced to the the cecum, identified by                            appendiceal orifice and ileocecal valve. The                            colonoscopy was performed without difficulty. The                            patient tolerated the procedure well. The quality                            of the bowel preparation was adequate. The                            ileocecal valve, appendiceal orifice, and rectum                            were photographed. Scope In: 2:39:07 PM Scope Out: 3:01:45 PM Total Procedure Duration: 0 hours 22 minutes 38 seconds  Findings:      The perianal and digital rectal examinations were normal.      The colon (entire examined portion) appeared normal.      External hemorrhoids were found during retroflexion. The hemorrhoids       were small.      Anal papilla(e) were hypertrophied. Impression:               - The entire examined colon is normal.                           - External hemorrhoids.                           - Anal papilla(e) were hypertrophied.                           -  No specimens collected. Moderate Sedation:      Moderate (conscious) sedation was administered by the endoscopy nurse       and supervised by the endoscopist. The following parameters were       monitored: oxygen saturation, heart rate, blood pressure, CO2       capnography and response to care. Total physician intraservice time was       30 minutes. Recommendation:           - Patient has a contact number available for                            emergencies. The signs and symptoms of potential                            delayed complications were discussed with the                            patient. Return to normal activities tomorrow.                            Written  discharge instructions were provided to the                            patient.                           - Resume previous diet today.                           - Continue present medications.                           - Repeat colonoscopy in 5 years for surveillance. Procedure Code(s):        --- Professional ---                           [phone], Colonoscopy, flexible; diagnostic, including                            collection of specimen(s) by brushing or washing,                            when performed (separate procedure)                           99153, Moderate sedation; each additional 15                            minutes intraservice time                           G0500, Moderate sedation services provided by the                            same physician or other qualified health care  professional performing a gastrointestinal                            endoscopic service that sedation supports,                            requiring the presence of an independent trained                            observer to assist in the monitoring of the                            patient's level of consciousness and physiological                            status; initial 15 minutes of intra-service time;                            patient age 21 years or older (additional time may                            be reported with [phone], as appropriate) Diagnosis Code(s):        --- Professional ---                           Z86.010, Personal history of colonic polyps                           K64.4, Residual hemorrhoidal skin tags                           K62.89, Other specified diseases of anus and rectum CPT copyright 2018 [employer]. All rights reserved. The codes documented in this report are preliminary and upon coder review may  be revised to meet current compliance requirements. Hildred Laser, MD Hildred Laser, MD 12/07/2018 3:11:45 PM This  report has been signed electronically. Number of Addenda: 0

## 2018-12-07 NOTE — Discharge Instructions (Signed)
Resume usual medications and diet as before. No driving for 24 hours. Next colonoscopy in 5 years.  PATIENT INSTRUCTIONS POST-ANESTHESIA  IMMEDIATELY FOLLOWING SURGERY:  Do not drive or operate machinery for the first twenty four hours after surgery.  Do not make any important decisions for twenty four hours after surgery or while taking narcotic pain medications or sedatives.  If you develop intractable nausea and vomiting or a severe headache please notify your doctor immediately.  FOLLOW-UP:  Please make an appointment with your surgeon as instructed. You do not need to follow up with anesthesia unless specifically instructed to do so.  WOUND CARE INSTRUCTIONS (if applicable):  Keep a dry clean dressing on the anesthesia/puncture wound site if there is drainage.  Once the wound has quit draining you may leave it open to air.  Generally you should leave the bandage intact for twenty four hours unless there is drainage.  If the epidural site drains for more than 36-48 hours please call the anesthesia department.  QUESTIONS?:  Please feel free to call your physician or the hospital operator if you have any questions, and they will be happy to assist you.      Colonoscopy, Adult, Care After This sheet gives you information about how to care for yourself after your procedure. Your doctor may also give you more specific instructions. If you have problems or questions, call your doctor. What can I expect after the procedure? After the procedure, it is common to have:  A small amount of blood in your poop for 24 hours.  Some gas.  Mild cramping or bloating in your belly. Follow these instructions at home: General instructions  For the first 24 hours after the procedure: ? Do not drive or use machinery. ? Do not sign important documents. ? Do not drink alcohol. ? Do your daily activities more slowly than normal. ? Eat foods that are soft and easy to digest.  Take over-the-counter or  prescription medicines only as told by your doctor. To help cramping and bloating:   Try walking around.  Put heat on your belly (abdomen) as told by your doctor. Use a heat source that your doctor recommends, such as a moist heat pack or a heating pad. ? Put a towel between your skin and the heat source. ? Leave the heat on for 20-30 minutes. ? Remove the heat if your skin turns bright red. This is especially important if you cannot feel pain, heat, or cold. You can get burned. Eating and drinking   Drink enough fluid to keep your pee (urine) clear or pale yellow.  Return to your normal diet as told by your doctor. Avoid heavy or fried foods that are hard to digest.  Avoid drinking alcohol for as long as told by your doctor. Contact a doctor if:  You have blood in your poop (stool) 2-3 days after the procedure. Get help right away if:  You have more than a small amount of blood in your poop.  You see large clumps of tissue (blood clots) in your poop.  Your belly is swollen.  You feel sick to your stomach (nauseous).  You throw up (vomit).  You have a fever.  You have belly pain that gets worse, and medicine does not help your pain. Summary  After the procedure, it is common to have a small amount of blood in your poop. You may also have mild cramping and bloating in your belly.  For the first 24 hours after  the procedure, do not drive or use machinery, do not sign important documents, and do not drink alcohol.  Get help right away if you have a lot of blood in your poop, feel sick to your stomach, have a fever, or have more belly pain. This information is not intended to replace advice given to you by your health care provider. Make sure you discuss any questions you have with your health care provider. Document Released: 12/25/2010 Document Revised: 09/22/2017 Document Reviewed: 08/16/2016 Elsevier Interactive Patient Education  2019 [employer].

## 2018-12-07 NOTE — H&P (Signed)
Kari Mason is an 53 y.o. female.   Chief Complaint: Patient is here for colonoscopy. HPI: This 53 year old African-American female who underwent average her screening colonoscopy in November, 2016 and  found to have a small and 20 mm tubular adenoma. She was therefore advised to return for repeat exam in 3 years.  She denies abdominal pain change in bowel habits or rectal bleeding. Family history is negative for CRC.   Past Medical History:  Diagnosis Date  . Allergy   . GERD (gastroesophageal reflux disease)   . Hypertension     Past Surgical History:  Procedure Laterality Date  . COLONOSCOPY N/A 10/15/2015   Procedure: COLONOSCOPY;  Surgeon: Rogene Houston, MD;  Location: [facility];  Service: Endoscopy;  Laterality: N/A;  1200  . TUBAL LIGATION      Family History  Problem Relation Age of Onset  . Hypertension Father   . Diabetes Sister   . Hypertension Sister    Social History:  reports that she has never smoked. She has never used smokeless tobacco. She reports that she does not drink alcohol or use drugs.  Allergies: No Known Allergies  Medications Prior to Admission  Medication Sig Dispense Refill  . ferrous sulfate 325 (65 FE) MG tablet Take 325 mg by mouth daily.     Marland Kitchen lisinopril-hydrochlorothiazide (PRINZIDE,ZESTORETIC) 20-12.5 MG tablet TAKE ONE & ONE-HALF TABLETS BY MOUTH ONCE DAILY (Patient taking differently: Take 1.5 tablets by mouth daily. ) 135 tablet 1  . Multiple Vitamin (MULTIVITAMIN WITH MINERALS) TABS tablet Take 1 tablet by mouth daily.      No results found for this or any previous visit (from the past 48 hour(s)). No results found.  ROS  Blood pressure 133/88, pulse 96, temperature 98.5 F (36.9 C), temperature source Oral, resp. rate 14, height 5\' 3"  (1.6 m), weight 81.6 kg, last menstrual period 11/16/2018, SpO2 100 %. Physical Exam  Constitutional: She appears well-developed and well-nourished.  HENT:  Mouth/Throat: Oropharynx is clear  and moist.  Eyes: Conjunctivae are normal. No scleral icterus.  Neck: No thyromegaly present.  Cardiovascular: Normal rate, regular rhythm and normal heart sounds.  No murmur heard. Respiratory: Effort normal and breath sounds normal.  GI:  Abdomen is full but soft and nontender with organomegaly or masses.  Musculoskeletal:        General: No edema.  Lymphadenopathy:    She has no cervical adenopathy.  Neurological: She is alert.  Skin: Skin is warm and dry.     Assessment/Plan History of tubular adenomas. Surveillance colonoscopy.  Hildred Laser, MD 12/07/2018, 2:26 PM

## 2018-12-13 ENCOUNTER — Ambulatory Visit: Payer: BLUE CROSS/BLUE SHIELD | Admitting: Family Medicine

## 2018-12-13 ENCOUNTER — Encounter (HOSPITAL_COMMUNITY): Payer: Self-pay | Admitting: Internal Medicine

## 2019-01-09 ENCOUNTER — Encounter: Payer: Self-pay | Admitting: Family Medicine

## 2019-01-09 ENCOUNTER — Ambulatory Visit: Payer: BLUE CROSS/BLUE SHIELD | Admitting: Family Medicine

## 2019-01-09 VITALS — BP 118/80 | HR 93 | Resp 15 | Ht 63.0 in | Wt 190.0 lb

## 2019-01-09 DIAGNOSIS — R7302 Impaired glucose tolerance (oral): Secondary | ICD-10-CM

## 2019-01-09 DIAGNOSIS — I10 Essential (primary) hypertension: Secondary | ICD-10-CM | POA: Diagnosis not present

## 2019-01-09 DIAGNOSIS — L659 Nonscarring hair loss, unspecified: Secondary | ICD-10-CM | POA: Insufficient documentation

## 2019-01-09 DIAGNOSIS — Z1231 Encounter for screening mammogram for malignant neoplasm of breast: Secondary | ICD-10-CM | POA: Diagnosis not present

## 2019-01-09 DIAGNOSIS — E669 Obesity, unspecified: Secondary | ICD-10-CM

## 2019-01-09 DIAGNOSIS — M25562 Pain in left knee: Secondary | ICD-10-CM | POA: Insufficient documentation

## 2019-01-09 DIAGNOSIS — G8929 Other chronic pain: Secondary | ICD-10-CM

## 2019-01-09 MED ORDER — IBUPROFEN 800 MG PO TABS: 800.0000 mg | ORAL_TABLET | Freq: Three times a day (TID) | ORAL | 0 refills | Status: DC | PRN

## 2019-01-09 MED ORDER — PREDNISONE 10 MG (21) PO TBPK: ORAL_TABLET | ORAL | 0 refills | Status: AC

## 2019-01-09 NOTE — Patient Instructions (Addendum)
Well woman / physical with pap in 5 months, call if you need me before.  Pls Schedule  [facility]ammogram 04/03 [facility]  Please get X ray of left knee  At hospital, this is osteoarthritis  Weight loss and tylenol for pain as needed   Ibuprofen can be taken for 5 days , then as needed, and take the prednisone for  5 days  Fasting lipid, cbc , cmp and EGFr and tSH 1 week before follow up           Alopecia Areata, Adult  Alopecia areata is a condition that causes you to lose hair. You may lose hair on your scalp in patches. In some cases, you may lose all the hair on your scalp (alopecia totalis) or all the hair from your face and body (alopecia universalis). Alopecia areata is an autoimmune disease. This means that your body's defense system (immune system) mistakes normal parts of the body for germs or other things that can make you sick. When you have alopecia areata, the immune system attacks the hair follicles. Alopecia areata usually develops in childhood, but it can develop at any age. For some people, their hair grows back on its own and hair loss does not happen again. For others, their hair may fall out and grow back in cycles. The hair loss may last many years. Having this condition can be emotionally difficult, but it is not dangerous. What are the causes? The cause of this condition is not known. What increases the risk? This condition is more likely to develop in people who have:  A family history of alopecia.  A family history of another autoimmune disease, including type 1 diabetes and rheumatoid arthritis.  Asthma and allergies.  Down syndrome. What are the signs or symptoms? Round spots of patchy hair loss on the scalp is the main symptom of this condition. The spots may be mildly itchy. Other symptoms include:  Short dark hairs in the bald patches that are wider at the top (exclamation point hairs).  Dents, white spots, or lines in the fingernails  or toenails.  Balding and body hair loss. This is rare. How is this diagnosed? This condition is diagnosed based on your symptoms and family history. Your health care provider will also check your scalp skin, teeth, and nails. Your health care provider may refer you to a specialist in hair and skin disorders (dermatologist). You may also have tests, including:  A hair pull test.  Blood tests or other screening tests to check for autoimmune diseases, such as thyroid disease or diabetes.  Skin biopsy to confirm the diagnosis.  A procedure to examine the skin with a lighted magnifying instrument (dermoscopy). How is this treated? There is no cure for alopecia areata. Treatment is aimed at promoting the regrowth of hair and preventing the immune system from overreacting. No single treatment is right for all people with alopecia areata. It depends on the type of hair loss you have and how severe it is. Work with your health care provider to find the best treatment for you. Treatment may include:  Having regular checkups to make sure the condition is not getting worse (watchful waiting).  Steroid creams or pills for 6-8 weeks to stop the immune reaction and help hair to regrow more quickly.  Other topical medicines to alter the immune system response and support the hair growth cycle.  Steroid injections.  Therapy and counseling with a support group or therapist if you  are having trouble coping with hair loss. Follow these instructions at home:  Learn as much as you can about your condition.  Apply topical creams only as told by your health care provider.  Take over-the-counter and prescription medicines only as told by your health care provider.  Consider getting a wig or products to make hair look fuller or to cover bald spots, if you feel uncomfortable with your appearance.  Get therapy or counseling if you are having a hard time coping with hair loss. Ask your health care provider to  recommend a counselor or support group.  Keep all follow-up visits as told by your health care provider. This is important. Contact a health care provider if:  Your hair loss gets worse, even with treatment.  You have new symptoms.  You are struggling emotionally. Summary  Alopecia areata is an autoimmune condition that makes your body's defense system (immune system) attack the hair follicles. This causes you to lose hair.  Treatments may include regular checkups to make sure that the condition is not getting worse (watchful waiting), medicines, and steroid injections. This information is not intended to replace advice given to you by your health care provider. Make sure you discuss any questions you have with your health care provider. Document Released: 06/26/2004 Document Revised: 12/10/2016 Document Reviewed: 12/10/2016 Elsevier Interactive Patient Education  2019 [employer].  What You Need to Know About Osteoarthritis Osteoarthritis is a type of arthritis that affects tissue that covers the ends of bones in joints (cartilage). Cartilage acts as a cushion between the bones and helps them move smoothly. Osteoarthritis results when cartilage in the joints gets worn down. Osteoarthritis is sometimes called "wear and tear" arthritis. Osteoarthritis can affect any joint and can make movement painful. Hips, knees, fingers, and toes are some of the joints that are most often affected by osteoarthritis. You may be more likely to develop osteoarthritis if:  You are middle-aged or older.  You are obese.  You have injured a joint or had surgery on a joint.  You have a family history of osteoarthritis. How can osteoarthritis affect me? Osteoarthritis can cause:  Pain and swelling in your joint.  Difficulty moving your joint.  A grating or scraping feeling inside the joint when you move it.  Popping or creaking sounds when you move. This condition can make it harder to do things  that you need or want to do each day. Osteoarthritis in a major joint, such as your knee or hip, can make it painful to walk or exercise. If you have osteoarthritis in your hands, you might not be able to grip items, twist your hand, or control small movements of your hands and fingers (fine motor skills). Over time, osteoarthritis could cause you to be less physically active. Being less active increases your risk for other long-term (chronic) health problems, such as diabetes and heart disease. What lifestyle changes can be made? You can lessen the impact that osteoarthritis has on your daily life by:  Switching to low-impact activities that do not put repeated pressure on your joints. For example, if you usually run or jog for exercise, try swimming or riding a bike instead.  Staying active. Build up to at least 150 minutes of physical activity each week to keep your joints healthy and keep your body strong.  Losing weight. If you are overweight or obese, losing weight can take pressure off of your joints. If you need help with weight loss, talk with your health  care provider or a diet and nutrition specialist (dietitian). What other changes can be made? You can also lessen the effect of osteoarthritis by:  Using assistive devices. Sometimes a brace, wrap, splint, specialized glove, or cane can help. Talk with your health care provider or physical therapist about when and how to use these.  Working with a physical therapist who can help you find ways to do your daily activities without harming your joints. A physical therapist can also teach you exercises and stretches to strengthen the muscles that support your joints.  Treating pain and inflammation. Take over-the-counter and prescription medicines for pain and inflammation only as told by your health care provider. If directed, you may put ice on the affected joint: ? If you have a removable assistive device, remove it as told by your health  care provider. ? Put ice in a plastic bag. ? Place a towel between your skin and the bag. ? Leave the ice on for 20 minutes, 2-3 times a day. If other measures do not work, you may need joint surgery, such as joint replacement. What can happen if changes are not made? Osteoarthritis is a condition that gets worse over time (a progressive condition). If you do not take steps to strengthen your body and to slow down the progress of the disease, your condition may get worse more quickly. Your joints may stiffen and become swollen, which will make them painful and hard to move. Where to find more information You can learn more about osteoarthritis from:  [employer] [place]: www.RadioScam.is  [employer] of Arthritis and Musculoskeletal and Skin Diseases: www.niams.CityPerson.tn Contact a health care provider if:  You cannot do your normal activities comfortably.  Your joint does not function at all.  Your pain is interfering with your sleep.  You are gaining weight.  Your joint appears to be changing in shape, instead of just being swollen and sore. Summary  Osteoarthritis is a painful joint disease that gets worse over time.  This condition can lead to other long-term (chronic) health problems.  There are changes that you can make to slow down the progression of the disease. This information is not intended to replace advice given to you by your health care provider. Make sure you discuss any questions you have with your health care provider. Document Released: 07/13/2016 Document Revised: 07/15/2016 Document Reviewed: 07/13/2016 Elsevier Interactive Patient Education  2019 [employer].

## 2019-01-09 NOTE — Assessment & Plan Note (Addendum)
Pain and stiffness of left kne up to a 5 , worse in am  X 1 month, oral anti inflammatories and weight loss, and quad strengthening

## 2019-01-10 ENCOUNTER — Ambulatory Visit: Payer: BLUE CROSS/BLUE SHIELD | Admitting: Family Medicine

## 2019-01-14 ENCOUNTER — Encounter: Payer: Self-pay | Admitting: Family Medicine

## 2019-01-14 DIAGNOSIS — E669 Obesity, unspecified: Secondary | ICD-10-CM | POA: Insufficient documentation

## 2019-01-14 NOTE — Assessment & Plan Note (Signed)
Patient educated about the importance of limiting  Carbohydrate intake , the need to commit to daily physical activity for a minimum of 30 minutes , and to commit weight loss. The fact that changes in all these areas will reduce or eliminate all together the development of diabetes is stressed.  Normalized x 1 year,great!  Diabetic Labs Latest Ref Rng & Units 08/11/2018 07/11/2017 12/27/2016 05/21/2016 10/23/2014  HbA1c <5.7 % - 5.6 5.4 5.5 5.8(H)  Chol <200 mg/dL 156 - 168 198 -  HDL >50 mg/dL 59 - 62 76 -  Calc LDL mg/dL (calc) 83 - 92 110 -  Triglycerides <150 mg/dL 62 - 70 60 -  Creatinine 0.50 - 1.05 mg/dL 0.68 0.61 0.60 0.62 0.72   BP/Weight 01/09/2019 12/07/2018 08/22/2018 08/08/2018 03/08/2018 02/15/2018 40/98/1191  Systolic BP 478 295 621 308 657 846 962  Diastolic BP 80 72 90 72 58 82 80  Wt. (Lbs) 190 180 195 199.12 193.12 190 194.75  BMI 33.66 31.89 34.54 35.27 34.21 33.66 34.5   No flowsheet data found.

## 2019-01-14 NOTE — Assessment & Plan Note (Signed)
Deteriorated.  Patient re-educated about  the importance of commitment to a  minimum of 150 minutes of exercise per week as able.  The importance of healthy food choices with portion control discussed, as well as eating regularly and within a 12 hour window most days. The need to choose "clean , green" food 50 to 75% of the time is discussed, as well as to make water the primary drink and set a goal of 64 ounces water daily.  Encouraged to start a food diary,  and to consider  joining a support group. Sample diet sheets offered. Goals set by the patient for the next several months.   Weight /BMI 01/09/2019 12/07/2018 08/22/2018  WEIGHT 190 lb 180 lb 195 lb  HEIGHT 5\' 3"  5\' 3"  5\' 3"   BMI 33.66 kg/m2 31.89 kg/m2 34.54 kg/m2

## 2019-01-14 NOTE — Progress Notes (Signed)
Kari Mason     MRN: [id number]      DOB: [birth year]   HPI Kari Mason is here for follow up and re-evaluation of chronic medical conditions, medication management and review of any available recent lab and radiology data.  Preventive health is updated, specifically  Cancer screening and Immunization.   Questions or concerns regarding consultations or procedures which the PT has had in the interim are  addressed. The PT denies any adverse reactions to current medications since the last visit.  There are no new concerns.  There are no specific complaints   ROS Denies recent fever or chills. Denies sinus pressure, nasal congestion, ear pain or sore throat. Denies chest congestion, productive cough or wheezing. Denies chest pains, palpitations and leg swelling Denies abdominal pain, nausea, vomiting,diarrhea or constipation.   Denies dysuria, frequency, hesitancy or incontinence. Denies headaches, seizures, numbness, or tingling. Denies depression, anxiety or insomnia. Worsening allopecia is a concern, Kari Mason  Is aware that it is incurable   PE  BP 118/80   Pulse 93   Resp 15   Ht 5\' 3"  (1.6 m)   Wt 190 lb (86.2 kg)   SpO2 98%   BMI 33.66 kg/m   Patient alert and oriented and in no cardiopulmonary distress.  HEENT: No facial asymmetry, EOMI,   oropharynx pink and moist.  Neck supple no JVD, no mass.  Chest: Clear to auscultation bilaterally.  CVS: S1, S2 no murmurs, no S3.Regular rate.  ABD: Soft non tender.   Ext: No edema  MS: Adequate ROM spine, shoulders, hips and  Reduced in left knee which has deformity and crepitus.  Skin: Intact, no ulcerations or rash noted.  Psych: Good eye contact, normal affect. Memory intact not anxious or depressed appearing.  CNS: CN 2-12 intact, power,  normal throughout.no focal deficits noted.   Assessment & Plan  Knee pain, left Pain and stiffness of left kne up to a 5 , worse in am  X 1 month, oral anti inflammatories and  weight loss, and quad strengthening  Essential hypertension Controlled, no change in medication DASH diet and commitment to daily physical activity for a minimum of 30 minutes discussed and encouraged, as a part of hypertension management. The importance of attaining a healthy weight is also discussed.  BP/Weight 01/09/2019 12/07/2018 08/22/2018 08/08/2018 03/08/2018 02/15/2018 40/98/1191  Systolic BP 478 295 621 308 657 846 962  Diastolic BP 80 72 90 72 58 82 80  Wt. (Lbs) 190 180 195 199.12 193.12 190 194.75  BMI 33.66 31.89 34.54 35.27 34.21 33.66 34.5       IGT (impaired glucose tolerance) Patient educated about the importance of limiting  Carbohydrate intake , the need to commit to daily physical activity for a minimum of 30 minutes , and to commit weight loss. The fact that changes in all these areas will reduce or eliminate all together the development of diabetes is stressed.  Normalized x 1 year,great!  Diabetic Labs Latest Ref Rng & Units 08/11/2018 07/11/2017 12/27/2016 05/21/2016 10/23/2014  HbA1c <5.7 % - 5.6 5.4 5.5 5.8(H)  Chol <200 mg/dL 156 - 168 198 -  HDL >50 mg/dL 59 - 62 76 -  Calc LDL mg/dL (calc) 83 - 92 110 -  Triglycerides <150 mg/dL 62 - 70 60 -  Creatinine 0.50 - 1.05 mg/dL 0.68 0.61 0.60 0.62 0.72   BP/Weight 01/09/2019 12/07/2018 08/22/2018 08/08/2018 03/08/2018 02/15/2018 95/28/4132  Systolic BP 440 102 725 366 440 347 425  Diastolic  BP 80 72 90 72 58 82 80  Wt. (Lbs) 190 180 195 199.12 193.12 190 194.75  BMI 33.66 31.89 34.54 35.27 34.21 33.66 34.5   No flowsheet data found.    Obesity (BMI 30.0-34.9) Deteriorated.  Patient re-educated about  the importance of commitment to a  minimum of 150 minutes of exercise per week as able.  The importance of healthy food choices with portion control discussed, as well as eating regularly and within a 12 hour window most days. The need to choose "clean , green" food 50 to 75% of the time is discussed, as well as to make water  the primary drink and set a goal of 64 ounces water daily.  Encouraged to start a food diary,  and to consider  joining a support group. Sample diet sheets offered. Goals set by the patient for the next several months.   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

## 2019-01-14 NOTE — Assessment & Plan Note (Signed)
Controlled, no change in medication DASH diet and commitment to daily physical activity for a minimum of 30 minutes discussed and encouraged, as a part of hypertension management. The importance of attaining a healthy weight is also discussed.  BP/Weight 01/09/2019 12/07/2018 08/22/2018 08/08/2018 03/08/2018 02/15/2018 79/44/4619  Systolic BP 012 224 114 643 142 767 011  Diastolic BP 80 72 90 72 58 82 80  Wt. (Lbs) 190 180 195 199.12 193.12 190 194.75  BMI 33.66 31.89 34.54 35.27 34.21 33.66 34.5

## 2019-01-14 NOTE — Assessment & Plan Note (Signed)
Progressively worsened in 9 years, involves anterior 2/3 of head, no interest in Dermatology eval, she has witness same in her sister

## 2019-01-26 ENCOUNTER — Other Ambulatory Visit: Payer: Self-pay

## 2019-01-26 ENCOUNTER — Telehealth: Payer: Self-pay | Admitting: *Deleted

## 2019-01-26 MED ORDER — LISINOPRIL-HYDROCHLOROTHIAZIDE 20-12.5 MG PO TABS: ORAL_TABLET | ORAL | 1 refills | Status: DC

## 2019-01-26 NOTE — Telephone Encounter (Signed)
Medication refilled and sent to requested pharmacy

## 2019-01-26 NOTE — Telephone Encounter (Signed)
Pt called in needing a refill on lisinopril sent to [facility] in [place]. She is out and stated she didn't have any refills.

## 2019-03-14 ENCOUNTER — Ambulatory Visit: Payer: BLUE CROSS/BLUE SHIELD

## 2019-05-01 ENCOUNTER — Ambulatory Visit: Payer: BLUE CROSS/BLUE SHIELD

## 2019-06-11 ENCOUNTER — Ambulatory Visit (INDEPENDENT_AMBULATORY_CARE_PROVIDER_SITE_OTHER): Payer: 59 | Admitting: Family Medicine

## 2019-06-11 ENCOUNTER — Encounter: Payer: Self-pay | Admitting: Family Medicine

## 2019-06-11 ENCOUNTER — Other Ambulatory Visit: Payer: Self-pay

## 2019-06-11 VITALS — BP 130/85 | HR 80 | Temp 98.4°F | Ht 63.0 in | Wt 193.3 lb

## 2019-06-11 DIAGNOSIS — Z13220 Encounter for screening for lipoid disorders: Secondary | ICD-10-CM

## 2019-06-11 DIAGNOSIS — R7302 Impaired glucose tolerance (oral): Secondary | ICD-10-CM

## 2019-06-11 DIAGNOSIS — I10 Essential (primary) hypertension: Secondary | ICD-10-CM | POA: Diagnosis not present

## 2019-06-11 DIAGNOSIS — E669 Obesity, unspecified: Secondary | ICD-10-CM | POA: Diagnosis not present

## 2019-06-11 DIAGNOSIS — E559 Vitamin D deficiency, unspecified: Secondary | ICD-10-CM

## 2019-06-11 DIAGNOSIS — Z0000 Encounter for general adult medical examination without abnormal findings: Secondary | ICD-10-CM

## 2019-06-11 DIAGNOSIS — Z2821 Immunization not carried out because of patient refusal: Secondary | ICD-10-CM

## 2019-06-11 DIAGNOSIS — E66811 Obesity, class 1: Secondary | ICD-10-CM

## 2019-06-11 MED ORDER — DICLOFENAC SODIUM 75 MG PO TBEC: DELAYED_RELEASE_TABLET | ORAL | 1 refills | Status: DC

## 2019-06-11 NOTE — Progress Notes (Signed)
    Kari Mason     MRN: [id number]      DOB: 11/11/1966  HPI: Patient is in for annual physical exam. Stressed due to ill health of her daughter, other Recent labs, if available are reviewed. Immunization is reviewed , and  She refuses shingrix despite education and is not planning to get the flu vaccine, encouraged her to reconsider both PE: BP 130/85 (BP Location: Right Arm, Patient Position: Sitting, Cuff Size: Normal)   Pulse 80   Temp 98.4 F (36.9 C)   Ht 5\' 3"  (1.6 m)   Wt 193 lb 4.8 oz (87.7 kg)   SpO2 98%   BMI 34.24 kg/m   Pleasant  female, alert and oriented x 3, in no cardio-pulmonary distress. Afebrile. HEENT No facial trauma or asymetry. Sinuses non tender.  Extra occullar muscles intact External ears normal Neck: supple, no adenopathy,JVD or thyromegaly.No bruits.  Chest: Clear to ascultation bilaterally.No crackles or wheezes. Non tender to palpation  Breast: Asymptomatic and mammogram scheduled for am, no exam done Cardiovascular system; Heart sounds normal,  S1 and  S2 ,no S3.  No murmur, or thrill. Apical beat not displaced Peripheral pulses normal.  Abdomen: Soft, non tender, No guarding, tenderness or rebound.   GU: Asymptomatic, no exam performed.   Musculoskeletal exam: Full ROM of spine, hips , shoulders and knees. No deformity ,swelling or crepitus noted. No muscle wasting or atrophy.   Neurologic: Cranial nerves 2 to 12 intact. Power, tone ,sensation and reflexes normal throughout. No disturbance in gait. No tremor.  Skin: Intact, no ulceration, erythema , scaling or rash noted. Pigmentation normal throughout  Psych; Normal mood and affect. Judgement and concentration normal   Assessment & Plan:  Annual physical exam Annual exam as documented. Counseling done  re healthy lifestyle involving commitment to 150 minutes exercise per week, heart healthy diet, and attaining healthy weight.The importance of adequate sleep also  discussed. Regular seat belt use and home safety, is also discussed. Changes in health habits are decided on by the patient with goals and time frames  set for achieving them. Immunization and cancer screening needs are specifically addressed at this visit.   Immunization refused Discussed and educated patient re need for shingrix , no interest currently but will consider, does not intend to take flu vaccine either but again will re consider  Obesity (BMI 30.0-34.9)  Patient re-educated about  the importance of commitment to a  minimum of 150 minutes of exercise per week as able.  The importance of healthy food choices with portion control discussed, as well as eating regularly and within a 12 hour window most days. The need to choose "clean , green" food 50 to 75% of the time is discussed, as well as to make water the primary drink and set a goal of 64 ounces water daily.    Weight /BMI 06/11/2019 01/09/2019 12/07/2018  WEIGHT 193 lb 4.8 oz 190 lb 180 lb  HEIGHT 5\' 3"  5\' 3"  5\' 3"   BMI 34.24 kg/m2 33.66 kg/m2 31.89 kg/m2

## 2019-06-11 NOTE — Assessment & Plan Note (Addendum)
Discussed and educated patient re need for shingrix , no interest currently but will consider, does not intend to take flu vaccine either but again will re consider

## 2019-06-11 NOTE — Assessment & Plan Note (Signed)

## 2019-06-11 NOTE — Patient Instructions (Signed)
F/U in 6 months, call if you need me before  Please give info re my chart access at checkout  Mammogram today   Fasting CBC, lipid, cmp and EGFr, HBA1C, TSH and vit D today  It is important that you exercise regularly at least 30 minutes 5 times a week. If you develop chest pain, have severe difficulty breathing, or feel very tired, stop exercising immediately and seek medical attention   Please increase vegetable, fruit and water, reduce sweets, cookies and ice cream and sodas  Weight loss goal of 6 to 8 pounds

## 2019-06-11 NOTE — Assessment & Plan Note (Signed)
  Patient re-educated about  the importance of commitment to a  minimum of 150 minutes of exercise per week as able.  The importance of healthy food choices with portion control discussed, as well as eating regularly and within a 12 hour window most days. The need to choose "clean , green" food 50 to 75% of the time is discussed, as well as to make water the primary drink and set a goal of 64 ounces water daily.    Weight /BMI 06/11/2019 01/09/2019 12/07/2018  WEIGHT 193 lb 4.8 oz 190 lb 180 lb  HEIGHT 5\' 3"  5\' 3"  5\' 3"   BMI 34.24 kg/m2 33.66 kg/m2 31.89 kg/m2

## 2019-06-12 ENCOUNTER — Ambulatory Visit
Admission: RE | Admit: 2019-06-12 | Discharge: 2019-06-12 | Disposition: A | Discharge status: Home / Self Care | Payer: 59 | Source: Ambulatory Visit | Attending: Family Medicine | Admitting: Family Medicine

## 2019-06-12 DIAGNOSIS — Z1231 Encounter for screening mammogram for malignant neoplasm of breast: Secondary | ICD-10-CM

## 2019-06-12 LAB — LIPID PANEL
Cholesterol: 205 mg/dL — ABNORMAL HIGH (ref <200)
HDL: 65 mg/dL (ref >=50)
LDL Cholesterol (Calc): 123 mg/dL (calc) — ABNORMAL HIGH
Non-HDL Cholesterol (Calc): 140 mg/dL (calc) — ABNORMAL HIGH (ref <130)
Total CHOL/HDL Ratio: 3.2 (calc) (ref <5.0)
Triglycerides: 73 mg/dL (ref <150)

## 2019-06-12 LAB — CBC
HCT: 37.9 % (ref 35.0–45.0)
Hemoglobin: 11.4 g/dL — ABNORMAL LOW (ref 11.7–15.5)
MCH: 21.9 pg — ABNORMAL LOW (ref 27.0–33.0)
MCHC: 30.1 g/dL — ABNORMAL LOW (ref 32.0–36.0)
MCV: 72.9 fL — ABNORMAL LOW (ref 80.0–100.0)
MPV: 12 fL (ref 7.5–12.5)
Platelets: 311 10*3/uL (ref 140–400)
RBC: 5.2 10*6/uL — ABNORMAL HIGH (ref 3.80–5.10)
RDW: 14.5 % (ref 11.0–15.0)
WBC: 6.8 10*3/uL (ref 3.8–10.8)

## 2019-06-12 LAB — COMPLETE METABOLIC PANEL WITH GFR
AG Ratio: 1.3 (calc) (ref 1.0–2.5)
ALT: 14 U/L (ref 6–29)
AST: 18 U/L (ref 10–35)
Albumin: 4.1 g/dL (ref 3.6–5.1)
Alkaline phosphatase (APISO): 58 U/L (ref 37–153)
BUN: 13 mg/dL (ref 7–25)
CO2: 30 mmol/L (ref 20–32)
Calcium: 9.5 mg/dL (ref 8.6–10.4)
Chloride: 103 mmol/L (ref 98–110)
Creat: 0.57 mg/dL (ref 0.50–1.05)
GFR, Est African American: 123 mL/min/{1.73_m2} (ref >=60)
GFR, Est Non African American: 106 mL/min/{1.73_m2} (ref >=60)
Globulin: 3.1 g/dL (calc) (ref 1.9–3.7)
Glucose, Bld: 108 mg/dL — ABNORMAL HIGH (ref 65–99)
Potassium: 4.4 mmol/L (ref 3.5–5.3)
Sodium: 140 mmol/L (ref 135–146)
Total Bilirubin: 0.4 mg/dL (ref 0.2–1.2)
Total Protein: 7.2 g/dL (ref 6.1–8.1)

## 2019-06-12 LAB — HEMOGLOBIN A1C
Hgb A1c MFr Bld: 5.8 % of total Hgb — ABNORMAL HIGH (ref <5.7)
Mean Plasma Glucose: 120 (calc)
eAG (mmol/L): 6.6 (calc)

## 2019-06-12 LAB — VITAMIN D 25 HYDROXY (VIT D DEFICIENCY, FRACTURES): Vit D, 25-Hydroxy: 35 ng/mL (ref 30–100)

## 2019-06-12 LAB — TSH: TSH: 1.68 mIU/L

## 2019-06-13 ENCOUNTER — Other Ambulatory Visit: Payer: Self-pay | Admitting: Family Medicine

## 2019-06-13 DIAGNOSIS — R928 Other abnormal and inconclusive findings on diagnostic imaging of breast: Secondary | ICD-10-CM

## 2019-06-20 ENCOUNTER — Ambulatory Visit
Admission: RE | Admit: 2019-06-20 | Discharge: 2019-06-20 | Disposition: A | Discharge status: Home / Self Care | Payer: 59 | Source: Ambulatory Visit | Attending: Family Medicine | Admitting: Family Medicine

## 2019-06-20 ENCOUNTER — Ambulatory Visit: Admission: RE | Admit: 2019-06-20 | Payer: 59 | Source: Ambulatory Visit

## 2019-06-20 DIAGNOSIS — R928 Other abnormal and inconclusive findings on diagnostic imaging of breast: Secondary | ICD-10-CM

## 2019-10-25 ENCOUNTER — Other Ambulatory Visit: Payer: Self-pay | Admitting: Family Medicine

## 2019-12-12 ENCOUNTER — Ambulatory Visit (INDEPENDENT_AMBULATORY_CARE_PROVIDER_SITE_OTHER): Payer: 59 | Admitting: Family Medicine

## 2019-12-12 ENCOUNTER — Encounter: Payer: Self-pay | Admitting: Family Medicine

## 2019-12-12 ENCOUNTER — Other Ambulatory Visit: Payer: Self-pay

## 2019-12-12 VITALS — BP 130/85 | Ht 63.0 in | Wt 193.0 lb

## 2019-12-12 DIAGNOSIS — R7301 Impaired fasting glucose: Secondary | ICD-10-CM

## 2019-12-12 DIAGNOSIS — Z1231 Encounter for screening mammogram for malignant neoplasm of breast: Secondary | ICD-10-CM

## 2019-12-12 DIAGNOSIS — R7302 Impaired glucose tolerance (oral): Secondary | ICD-10-CM

## 2019-12-12 DIAGNOSIS — E559 Vitamin D deficiency, unspecified: Secondary | ICD-10-CM | POA: Diagnosis not present

## 2019-12-12 DIAGNOSIS — I10 Essential (primary) hypertension: Secondary | ICD-10-CM | POA: Diagnosis not present

## 2019-12-12 DIAGNOSIS — E669 Obesity, unspecified: Secondary | ICD-10-CM

## 2019-12-12 NOTE — Assessment & Plan Note (Signed)
Controlled, no change in medication DASH diet and commitment to daily physical activity for a minimum of 30 minutes discussed and encouraged, as a part of hypertension management. The importance of attaining a healthy weight is also discussed.  BP/Weight 12/12/2019 06/11/2019 01/09/2019 12/07/2018 08/22/2018 [id number]  Systolic BP [id number]  Diastolic BP 85 85 80 72 90 72 58  Wt. (Lbs) 193 193.3 190 180 195 199.12 193.12  BMI 34.19 34.24 33.66 31.89 34.54 35.27 34.21

## 2019-12-12 NOTE — Addendum Note (Signed)
Addended by: Eual Fines on: 12/12/2019 01:48 PM   Modules accepted: Orders

## 2019-12-12 NOTE — Assessment & Plan Note (Signed)
  Patient re-educated about  the importance of commitment to a  minimum of 150 minutes of exercise per week as able.  The importance of healthy food choices with portion control discussed, as well as eating regularly and within a 12 hour window most days. The need to choose "clean , green" food 50 to 75% of the time is discussed, as well as to make water the primary drink and set a goal of 64 ounces water daily.    Weight /BMI 12/12/2019 06/11/2019 01/09/2019  WEIGHT 193 lb 193 lb 4.8 oz 190 lb  HEIGHT 5\' 3"  5\' 3"  5\' 3"   BMI 34.19 kg/m2 34.24 kg/m2 33.66 kg/m2

## 2019-12-12 NOTE — Patient Instructions (Signed)
Annual physical exam with pap with MD in May, please verify day off with pt, and schedule ( Monday better option) Please schedule mammogram , verify day off for pt ,  ( off alternate Mondays)  Please  get fasting CBC, lipid, cmp and eGFR, hBA1C, tSH and vit d 1 week before May appointment  It is important that you exercise regularly at least 30 minutes 5 times a week. If you develop chest pain, have severe difficulty breathing, or feel very tired, stop exercising immediately and seek medical attention   Think about what you will eat, plan ahead. Choose " clean, green, fresh or frozen" over canned, processed or packaged foods which are more sugary, salty and fatty. 70 to 75% of food eaten should be vegetables and fruit. Three meals at set times with snacks allowed between meals, but they must be fruit or vegetables. Aim to eat over a 12 hour period , example 7 am to 7 pm, and STOP after  your last meal of the day. Drink water,generally about 64 ounces per day, no other drink is as healthy. Fruit juice is best enjoyed in a healthy way, by EATING the fruit.   Condolence re recent loss  Best for 2021!

## 2019-12-12 NOTE — Assessment & Plan Note (Signed)
Patient educated about the importance of limiting  Carbohydrate intake , the need to commit to daily physical activity for a minimum of 30 minutes , and to commit weight loss. The fact that changes in all these areas will reduce or eliminate all together the development of diabetes is stressed.  Updated lab needed at/ before next visit.  Diabetic Labs Latest Ref Rng & Units 06/11/2019 08/11/2018 07/11/2017 12/27/2016 05/21/2016  HbA1c <5.7 % of total Hgb 5.8(H) - 5.6 5.4 5.5  Chol <200 mg/dL 205(H) 156 - 168 198  HDL > OR = 50 mg/dL 65 59 - 62 76  Calc LDL mg/dL (calc) 123(H) 83 - 92 110  Triglycerides <150 mg/dL 73 62 - 70 60  Creatinine 0.50 - 1.05 mg/dL 0.57 0.68 0.61 0.60 0.62   BP/Weight 12/12/2019 06/11/2019 01/09/2019 12/07/2018 08/22/2018 [id number]  Systolic BP [id number] [id number] [id number] 123456 [id number]  Diastolic BP 85 85 80 72 90 72 58  Wt. (Lbs) 193 193.3 190 180 195 199.12 193.12  BMI 34.19 34.24 33.66 31.89 34.54 35.27 34.21   No flowsheet data found.

## 2019-12-12 NOTE — Progress Notes (Signed)
Virtual Visit via Telephone Note  I connected with Kari Mason on 12/12/19 at  9:00 AM EST by telephone and verified that I am speaking with the correct person using two identifiers.  Location: Patient: work Provider: office   I discussed the limitations, risks, security and privacy concerns of performing an evaluation and management service by telephone and the availability of in person appointments. I also discussed with the patient that there may be a patient responsible charge related to this service. The patient expressed understanding and agreed to proceed.   History of Present Illness: F/U chronic problems and review and update immunization, labs and screening tests Feels well, only c/o new stress and mild depression due t unexpected loss of her children's father , both daughters are having a hard time with this. Denies recent fever or chills. Denies sinus pressure, nasal congestion, ear pain or sore throat. Denies chest congestion, productive cough or wheezing. Denies chest pains, palpitations and leg swelling Denies abdominal pain, nausea, vomiting,diarrhea or constipation.   Denies dysuria, frequency, hesitancy or incontinence. Denies joint pain, swelling and limitation in mobility. Denies headaches, seizures, numbness, or tingling. Denies depression, anxiety or insomnia. Denies skin break down or rash.      Observations/Objective: BP 130/85   Ht 5\' 3"  (1.6 m)   Wt 193 lb (87.5 kg)   LMP 11/16/2018 (Approximate)   BMI 34.19 kg/m    Assessment and Plan:  Essential hypertension Controlled, no change in medication DASH diet and commitment to daily physical activity for a minimum of 30 minutes discussed and encouraged, as a part of hypertension management. The importance of attaining a healthy weight is also discussed.  BP/Weight 12/12/2019 06/11/2019 01/09/2019 12/07/2018 08/22/2018 [id number]  Systolic BP [id number] [id number] [id number] 123456 [id number]  Diastolic BP 85 85 80 72 90  72 58  Wt. (Lbs) 193 193.3 190 180 195 199.12 193.12  BMI 34.19 34.24 33.66 31.89 34.54 35.27 34.21       Obesity (BMI 30.0-34.9)  Patient re-educated about  the importance of commitment to a  minimum of 150 minutes of exercise per week as able.  The importance of healthy food choices with portion control discussed, as well as eating regularly and within a 12 hour window most days. The need to choose "clean , green" food 50 to 75% of the time is discussed, as well as to make water the primary drink and set a goal of 64 ounces water daily.    Weight /BMI 12/12/2019 06/11/2019 01/09/2019  WEIGHT 193 lb 193 lb 4.8 oz 190 lb  HEIGHT 5\' 3"  5\' 3"  5\' 3"   BMI 34.19 kg/m2 34.24 kg/m2 33.66 kg/m2      IGT (impaired glucose tolerance) Patient educated about the importance of limiting  Carbohydrate intake , the need to commit to daily physical activity for a minimum of 30 minutes , and to commit weight loss. The fact that changes in all these areas will reduce or eliminate all together the development of diabetes is stressed.  Updated lab needed at/ before next visit.  Diabetic Labs Latest Ref Rng & Units 06/11/2019 08/11/2018 07/11/2017 12/27/2016 05/21/2016  HbA1c <5.7 % of total Hgb 5.8(H) - 5.6 5.4 5.5  Chol <200 mg/dL 205(H) 156 - 168 198  HDL > OR = 50 mg/dL 65 59 - 62 76  Calc LDL mg/dL (calc) 123(H) 83 - 92 110  Triglycerides <150 mg/dL 73 62 - 70 60  Creatinine 0.50 - 1.05 mg/dL 0.57 0.68 0.61  0.60 0.62   BP/Weight 12/12/2019 06/11/2019 01/09/2019 12/07/2018 08/22/2018 [id number]  Systolic BP [id number] 123456 [id number] Q000111Q 123456 [id number]  Diastolic BP 85 85 80 72 90 72 58  Wt. (Lbs) 193 193.3 190 180 195 199.12 193.12  BMI 34.19 34.24 33.66 31.89 34.54 35.27 34.21   No flowsheet data found.     Follow Up Instructions:    I discussed the assessment and treatment plan with the patient. The patient was provided an opportunity to ask questions and all were answered. The patient agreed with the plan and  demonstrated an understanding of the instructions.   The patient was advised to call back or seek an in-person evaluation if the symptoms worsen or if the condition fails to improve as anticipated.  I provided 15  minutes of non-face-to-face time during this encounter.   Tula Nakayama, MD

## 2019-12-13 ENCOUNTER — Other Ambulatory Visit: Payer: Self-pay

## 2019-12-13 MED ORDER — LISINOPRIL-HYDROCHLOROTHIAZIDE 20-12.5 MG PO TABS: ORAL_TABLET | ORAL | 1 refills | Status: DC

## 2020-03-14 ENCOUNTER — Telehealth: Payer: Self-pay

## 2020-03-14 ENCOUNTER — Other Ambulatory Visit: Payer: Self-pay

## 2020-03-14 MED ORDER — LISINOPRIL-HYDROCHLOROTHIAZIDE 20-12.5 MG PO TABS: ORAL_TABLET | ORAL | 1 refills | Status: DC

## 2020-03-14 NOTE — Telephone Encounter (Signed)
lisinopril-hydrochlorothiazide (ZESTORETIC) 20-12.5 MG tablet  Send to [facility] in [place]

## 2020-03-14 NOTE — Telephone Encounter (Signed)
refilled 

## 2020-04-14 ENCOUNTER — Encounter: Payer: 59 | Admitting: Family Medicine

## 2020-06-12 ENCOUNTER — Other Ambulatory Visit: Payer: Self-pay

## 2020-06-12 ENCOUNTER — Ambulatory Visit
Admission: RE | Admit: 2020-06-12 | Discharge: 2020-06-12 | Disposition: A | Discharge status: Home / Self Care | Payer: BC Managed Care – PPO | Source: Ambulatory Visit | Attending: Family Medicine | Admitting: Family Medicine

## 2020-06-12 DIAGNOSIS — Z1231 Encounter for screening mammogram for malignant neoplasm of breast: Secondary | ICD-10-CM

## 2020-06-24 ENCOUNTER — Other Ambulatory Visit (HOSPITAL_COMMUNITY)
Admission: RE | Admit: 2020-06-24 | Discharge: 2020-06-24 | Disposition: A | Discharge status: Home / Self Care | Payer: BC Managed Care – PPO | Source: Ambulatory Visit | Attending: Family Medicine | Admitting: Family Medicine

## 2020-06-24 ENCOUNTER — Ambulatory Visit (INDEPENDENT_AMBULATORY_CARE_PROVIDER_SITE_OTHER): Payer: BC Managed Care – PPO | Admitting: Family Medicine

## 2020-06-24 ENCOUNTER — Encounter: Payer: Self-pay | Admitting: Family Medicine

## 2020-06-24 ENCOUNTER — Other Ambulatory Visit: Payer: Self-pay

## 2020-06-24 VITALS — BP 140/82 | HR 84 | Resp 16 | Ht 63.0 in | Wt 193.0 lb

## 2020-06-24 DIAGNOSIS — E66811 Obesity, class 1: Secondary | ICD-10-CM

## 2020-06-24 DIAGNOSIS — E669 Obesity, unspecified: Secondary | ICD-10-CM | POA: Diagnosis not present

## 2020-06-24 DIAGNOSIS — Z0000 Encounter for general adult medical examination without abnormal findings: Secondary | ICD-10-CM | POA: Diagnosis not present

## 2020-06-24 DIAGNOSIS — E559 Vitamin D deficiency, unspecified: Secondary | ICD-10-CM

## 2020-06-24 DIAGNOSIS — Z124 Encounter for screening for malignant neoplasm of cervix: Secondary | ICD-10-CM | POA: Insufficient documentation

## 2020-06-24 DIAGNOSIS — Z1159 Encounter for screening for other viral diseases: Secondary | ICD-10-CM

## 2020-06-24 DIAGNOSIS — R7302 Impaired glucose tolerance (oral): Secondary | ICD-10-CM

## 2020-06-24 DIAGNOSIS — I10 Essential (primary) hypertension: Secondary | ICD-10-CM | POA: Diagnosis not present

## 2020-06-24 NOTE — Assessment & Plan Note (Signed)

## 2020-06-24 NOTE — Patient Instructions (Addendum)
F/U in office with MD in 5  Months, call if you need  Good that you  Are going to get the vaccine, please follow through  Pap today Fasting CBC, lipid, cmp and eGFr, TSH, HBa1C vit D and hepatitis C as soon as possible  It is important that you exercise regularly at least 30 minutes 5 times a week. If you develop chest pain, have severe difficulty breathing, or feel very tired, stop exercising immediately and seek medical attention   Think about what you will eat, plan ahead. Choose " clean, green, fresh or frozen" over canned, processed or packaged foods which are more sugary, salty and fatty. 70 to 75% of food eaten should be vegetables and fruit. Three meals at set times with snacks allowed between meals, but they must be fruit or vegetables. Aim to eat over a 12 hour period , example 7 am to 7 pm, and STOP after  your last meal of the day. Drink water,generally about 64 ounces per day, no other drink is as healthy. Fruit juice is best enjoyed in a healthy way, by EATING the fruit.  Thanks for choosing [facility], we consider it a privelige to serve you.  

## 2020-06-26 LAB — CYTOLOGY - PAP
Comment: NEGATIVE
Diagnosis: UNDETERMINED — AB
High risk HPV: NEGATIVE

## 2020-06-29 ENCOUNTER — Encounter: Payer: Self-pay | Admitting: Family Medicine

## 2020-06-29 NOTE — Progress Notes (Signed)
    Kari Mason     MRN: [id number]      DOB: 24-Sep-1966  HPI: Patient is in for annual physical exam. C/o hand pain associated with her job, no interest in pursuing this at this time Recent labs, if available are reviewed. Immunization is reviewed , and  She finally has decided to get the Covid vaccine, which is great   PE: BP 140/82   Pulse 84   Resp 16   Ht 5\' 3"  (1.6 m)   Wt 193 lb (87.5 kg)   LMP 11/16/2018 (Approximate)   SpO2 98%   BMI 34.19 kg/m   Pleasant  female, alert and oriented x 3, in no cardio-pulmonary distress. Afebrile. HEENT No facial trauma or asymetry. Sinuses non tender.  Extra occullar muscles intact.. External ears normal, . Neck: supple, no adenopathy,JVD or thyromegaly.No bruits.  Chest: Clear to ascultation bilaterally.No crackles or wheezes. Non tender to palpation  Breast: No asymetry,no masses or lumps. No tenderness. No nipple discharge or inversion. No axillary or supraclavicular adenopathy  Cardiovascular system; Heart sounds normal,  S1 and  S2 ,no S3.  No murmur, or thrill. Apical beat not displaced Peripheral pulses normal.  Abdomen: Soft, non tender, no organomegaly or masses. No bruits. Bowel sounds normal. No guarding, tenderness or rebound.   GU: External genitalia normal female genitalia , normal female distribution of hair. No lesions. Urethral meatus normal in size, no  Prolapse, no lesions visibly  Present. Bladder non tender. Vagina pink and moist , with no visible lesions , discharge present . Adequate pelvic support no  cystocele or rectocele noted Cervix pink and appears healthy, no lesions or ulcerations noted, no discharge noted from os Uterus normal size, no adnexal masses, no cervical motion or adnexal tenderness.   Musculoskeletal exam: Full ROM of spine, hips , shoulders and knees. No deformity ,swelling or crepitus noted. No muscle wasting or atrophy.   Neurologic: Cranial nerves 2 to 12  intact. Power, tone ,sensation and reflexes normal throughout. No disturbance in gait. No tremor.  Skin: Intact, no ulceration, erythema , scaling or rash noted. Pigmentation normal throughout  Psych; Normal mood and affect. Judgement and concentration normal   Assessment & Plan:  Annual physical exam Annual exam as documented. Counseling done  re healthy lifestyle involving commitment to 150 minutes exercise per week, heart healthy diet, and attaining healthy weight.The importance of adequate sleep also discussed. Regular seat belt use and home safety, is also discussed. Changes in health habits are decided on by the patient with goals and time frames  set for achieving them. Immunization and cancer screening needs are specifically addressed at this visit.

## 2020-08-05 ENCOUNTER — Encounter: Payer: BC Managed Care – PPO | Admitting: Family Medicine

## 2020-09-08 ENCOUNTER — Telehealth: Payer: Self-pay | Admitting: Family Medicine

## 2020-09-08 NOTE — Telephone Encounter (Signed)
Patient calling requesting something for hot flashes she states they just recently started she uses [facility] in [place] ph# [phone]

## 2020-09-08 NOTE — Telephone Encounter (Signed)
Please advise 

## 2020-09-08 NOTE — Telephone Encounter (Signed)
Pls advise first line management of hot flashes; No medication  Dress cooly and in layers , so extra clothing can be removed Drink cold rather than hot beverages. Commit to daily exercise for 30 minutes Use a fan.  Try this for the next  6 to 4 weeks   Hot flashes are " natural' and tend to lessen with time

## 2020-09-09 NOTE — Telephone Encounter (Signed)
Advised patient of providers treatment plan with verbal understanding.

## 2020-11-12 ENCOUNTER — Ambulatory Visit: Payer: BC Managed Care – PPO | Admitting: Family Medicine

## 2020-11-12 ENCOUNTER — Other Ambulatory Visit: Payer: Self-pay

## 2020-11-12 ENCOUNTER — Emergency Department (HOSPITAL_COMMUNITY)
Admission: EM | Admit: 2020-11-12 | Discharge: 2020-11-12 | Disposition: A | Discharge status: Home / Self Care | Payer: BC Managed Care – PPO | Attending: Emergency Medicine | Admitting: Emergency Medicine

## 2020-11-12 ENCOUNTER — Encounter (HOSPITAL_COMMUNITY): Payer: Self-pay | Admitting: Emergency Medicine

## 2020-11-12 ENCOUNTER — Encounter: Payer: Self-pay | Admitting: Family Medicine

## 2020-11-12 VITALS — BP 138/80 | HR 73 | Resp 16 | Ht 63.0 in | Wt 189.0 lb

## 2020-11-12 DIAGNOSIS — I10 Essential (primary) hypertension: Secondary | ICD-10-CM

## 2020-11-12 DIAGNOSIS — G44219 Episodic tension-type headache, not intractable: Secondary | ICD-10-CM | POA: Diagnosis not present

## 2020-11-12 DIAGNOSIS — R42 Dizziness and giddiness: Secondary | ICD-10-CM | POA: Diagnosis present

## 2020-11-12 DIAGNOSIS — R519 Headache, unspecified: Secondary | ICD-10-CM | POA: Diagnosis not present

## 2020-11-12 DIAGNOSIS — Z5321 Procedure and treatment not carried out due to patient leaving prior to being seen by health care provider: Secondary | ICD-10-CM | POA: Insufficient documentation

## 2020-11-12 MED ORDER — MECLIZINE HCL 25 MG PO TABS: 25.0000 mg | ORAL_TABLET | Freq: Three times a day (TID) | ORAL | 0 refills | Status: DC | PRN

## 2020-11-12 MED ORDER — TRIAMTERENE-HCTZ 37.5-25 MG PO TABS: 1.0000 | ORAL_TABLET | Freq: Every day | ORAL | 3 refills | Status: DC

## 2020-11-12 MED ORDER — ONDANSETRON HCL 4 MG PO TABS: 4.0000 mg | ORAL_TABLET | Freq: Three times a day (TID) | ORAL | 0 refills | Status: DC | PRN

## 2020-11-12 NOTE — ED Triage Notes (Signed)
Pt c/o dizziness and headaches since Friday. Pt states she had a fall Friday.

## 2020-11-12 NOTE — Assessment & Plan Note (Addendum)
1 day onset , re eval on 11/17/2020 and antivert, work excuse

## 2020-11-12 NOTE — Progress Notes (Signed)
   NOEMI BELLISSIMO     MRN: [id number]      DOB: 11/17/1966   HPI Ms. 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 Denies sinus pressure, nasal congestion, ear pain or sore throat. Denies chest congestion,  or wheezing. Denies chest pains, palpitations and leg swelling Denies abdominal pain, nausea, vomiting,diarrhea or constipation.   Denies dysuria, frequency, hesitancy or incontinence. Denies joint pain, swelling and limitation in mobility. Denies headaches, seizures, numbness, or tingling. Denies depression, anxiety or insomnia. Denies skin break down or rash.   PE  BP 138/80   Pulse 73   Resp 16   Ht 5\' 3"  (1.6 m)   Wt 189 lb 0.6 oz (85.7 kg)   LMP 11/16/2018 (Approximate)   SpO2 100%   BMI 33.49 kg/m   Patient alert and oriented and in no cardiopulmonary distress.  HEENT: No facial asymmetry, EOMI,     Neck supple .No nystagmus   Chest: Clear to auscultation bilaterally.  CVS: S1, S2 no murmurs, no S3.Regular rate.  ABD: Soft non tender.   Ext: No edema  MS: Adequate ROM spine, shoulders, hips and knees.  Skin: Intact, no ulcerations or rash noted.  Psych: Good eye contact, normal affect. Memory intact not anxious or depressed appearing.  CNS: CN 2-12 intact, power,  normal throughout.no focal deficits noted.   Assessment & Plan  Vertigo 1 day onset , re eval on 11/17/2020 and  antivert, work excuse  Essential hypertension Uncontrolled and ACE intolerant, chang to maxzide DASH diet and commitment to daily physical activity for a minimum of 30 minutes discussed and encouraged, as a part of hypertension management. The importance of attaining a healthy weight is also discussed.  BP/Weight 11/12/2020 11/12/2020 06/24/2020 12/12/2019 06/11/2019 05/13/5915 02/11/4664  Systolic BP 993 570 177 939 030 092 330  Diastolic BP 80 076 82 85 85 80 72  Wt. (Lbs) 189.04 160 193 193 193.3 190 180  BMI 33.49 28.34 34.19 34.19 34.24 33.66 31.89       Headache Intermittent bitemporal headache s/p fall with head trauma on the job on 11/03/2020. Neurologic exm at visit is normal Tylenol given and recommended for use, also advised her to  Have evaluation by [facility]

## 2020-11-12 NOTE — Assessment & Plan Note (Signed)
Uncontrolled and ACE intolerant, chang to maxzide DASH diet and commitment to daily physical activity for a minimum of 30 minutes discussed and encouraged, as a part of hypertension management. The importance of attaining a healthy weight is also discussed.  BP/Weight 11/12/2020 11/12/2020 06/24/2020 12/12/2019 06/11/2019 03/11/8872 06/08/815  Systolic BP 838 706 582 608 883 584 465  Diastolic BP 80 207 82 85 85 80 72  Wt. (Lbs) 189.04 160 193 193 193.3 190 180  BMI 33.49 28.34 34.19 34.19 34.24 33.66 31.89

## 2020-11-12 NOTE — Patient Instructions (Signed)
F/u in office with MD re eval bP on 11/17/2020, call if you need me sooner  Tylenol 2 tablets in office for headache, and you may take 325 mg tylenol every 8 hours as needed , for headache  Zofran and meclizine are prescribed for headache and nausea  NEW for blood pressure is triamterene/hctz ONE daily, STOP zestoretic, start new medication today  Work excuse from 12/08 , return 11/18/2020  Let company dr follow up on your headaches which started after job injury on 11/03/2020

## 2020-11-12 NOTE — ED Notes (Signed)
Per registration pt left the facility 

## 2020-11-15 ENCOUNTER — Encounter: Payer: Self-pay | Admitting: Family Medicine

## 2020-11-15 DIAGNOSIS — R519 Headache, unspecified: Secondary | ICD-10-CM | POA: Insufficient documentation

## 2020-11-15 NOTE — Assessment & Plan Note (Signed)
Intermittent bitemporal headache s/p fall with head trauma on the job on 11/03/2020. Neurologic exm at visit is normal Tylenol given and recommended for use, also advised her to  Have evaluation by [employer]

## 2020-11-24 ENCOUNTER — Encounter: Payer: Self-pay | Admitting: Family Medicine

## 2020-11-24 ENCOUNTER — Other Ambulatory Visit: Payer: Self-pay

## 2020-11-24 ENCOUNTER — Ambulatory Visit (INDEPENDENT_AMBULATORY_CARE_PROVIDER_SITE_OTHER): Payer: BC Managed Care – PPO | Admitting: Family Medicine

## 2020-11-24 VITALS — BP 134/84 | HR 96 | Resp 16 | Ht 63.0 in | Wt 187.0 lb

## 2020-11-24 DIAGNOSIS — K219 Gastro-esophageal reflux disease without esophagitis: Secondary | ICD-10-CM

## 2020-11-24 DIAGNOSIS — R519 Headache, unspecified: Secondary | ICD-10-CM | POA: Diagnosis not present

## 2020-11-24 DIAGNOSIS — E669 Obesity, unspecified: Secondary | ICD-10-CM

## 2020-11-24 DIAGNOSIS — G8929 Other chronic pain: Secondary | ICD-10-CM

## 2020-11-24 DIAGNOSIS — I10 Essential (primary) hypertension: Secondary | ICD-10-CM

## 2020-11-24 DIAGNOSIS — R42 Dizziness and giddiness: Secondary | ICD-10-CM

## 2020-11-24 DIAGNOSIS — R1013 Epigastric pain: Secondary | ICD-10-CM

## 2020-11-24 MED ORDER — PANTOPRAZOLE SODIUM 40 MG PO TBEC: 40.0000 mg | DELAYED_RELEASE_TABLET | Freq: Every day | ORAL | 3 refills | Status: DC

## 2020-11-24 NOTE — Assessment & Plan Note (Signed)
resolved 

## 2020-11-24 NOTE — Assessment & Plan Note (Signed)
Uncontrolled, check breath test Start protonix, stop caffeine

## 2020-11-24 NOTE — Progress Notes (Signed)
   Kari Mason     MRN: [id number]      DOB: 15-Sep-1966   HPI Kari Mason is here for follow up and re-evaluation of chronic medical conditions, medication management and review of any available recent lab and radiology data.  Preventive health is updated, specifically  Cancer screening and Immunization.   Questions or concerns regarding consultations or procedures which the PT has had in the interim are  addressed. The PT denies any adverse reactions to current medications since the last visit.  C/o uncontrolled reflux, using zofran for relief Dizziness and headache have resolved  ROS Denies recent fever or chills. Denies sinus pressure, nasal congestion, ear pain or sore throat. Denies chest congestion, productive cough or wheezing. Denies chest pains, palpitations and leg swelling Denies abdominal pain, nausea, vomiting,diarrhea or constipation.   Denies dysuria, frequency, hesitancy or incontinence. Denies joint pain, swelling and limitation in mobility. Denies headaches, seizures, numbness, or tingling. Denies depression, anxiety or insomnia. Denies skin break down or rash.   PE  BP 134/84   Pulse 96   Resp 16   Ht 5\' 3"  (1.6 m)   Wt 187 lb (84.8 kg)   LMP 11/16/2018 (Approximate)   SpO2 100%   BMI 33.13 kg/m    Patient alert and oriented and in no cardiopulmonary distress.  HEENT: No facial asymmetry, EOMI,     Neck supple .  Chest: Clear to auscultation bilaterally.  CVS: S1, S2 no murmurs, no S3.Regular rate.  ABD: Soft non tender.   Ext: No edema  MS: Adequate ROM spine, shoulders, hips and knees.  Skin: Intact, no ulcerations or rash noted.  Psych: Good eye contact, normal affect. Memory intact not anxious or depressed appearing.  CNS: CN 2-12 intact, power,  normal throughout.no focal deficits noted.   Assessment & Plan  Headache resolved  Vertigo resolved  Obesity (BMI 30.0-34.9)  Patient re-educated about  the importance of commitment to  a  minimum of 150 minutes of exercise per week as able.  The importance of healthy food choices with portion control discussed, as well as eating regularly and within a 12 hour window most days. The need to choose "clean , green" food 50 to 75% of the time is discussed, as well as to make water the primary drink and set a goal of 64 ounces water daily.    Weight /BMI 11/24/2020 11/12/2020 11/12/2020  WEIGHT 187 lb 189 lb 0.6 oz 160 lb  HEIGHT 5\' 3"  5\' 3"  5\' 3"   BMI 33.13 kg/m2 33.49 kg/m2 28.34 kg/m2      Essential hypertension Controlled, no change in medication DASH diet and commitment to daily physical activity for a minimum of 30 minutes discussed and encouraged, as a part of hypertension management. The importance of attaining a healthy weight is also discussed.  BP/Weight 11/24/2020 11/12/2020 11/12/2020 06/24/2020 12/12/2019 03/11/5680 01/12/5169  Systolic BP 017 494 496 759 163 846 659  Diastolic BP 84 80 935 82 85 85 80  Wt. (Lbs) 187 189.04 160 193 193 193.3 190  BMI 33.13 33.49 28.34 34.19 34.19 34.24 33.66       GERD (gastroesophageal reflux disease) Uncontrolled, check breath test Start protonix, stop caffeine

## 2020-11-24 NOTE — Patient Instructions (Signed)
F/U in office with Md in 4.5 month, call if you need me sooner  Continue current  BP medicine  PLEASE stop all caffeine  Labs today already ordered in July, please add H pylori breath test to lab today  New for reflux is pantoprazole 20 mg one daily, stop all caffeine please  It is important that you exercise regularly at least 30 minutes 5 times a week. If you develop chest pain, have severe difficulty breathing, or feel very tired, stop exercising immediately and seek medical attention   Think about what you will eat, plan ahead. Choose " clean, green, fresh or frozen" over canned, processed or packaged foods which are more sugary, salty and fatty. 70 to 75% of food eaten should be vegetables and fruit. Three meals at set times with snacks allowed between meals, but they must be fruit or vegetables. Aim to eat over a 12 hour period , example 7 am to 7 pm, and STOP after  your last meal of the day. Drink water,generally about 64 ounces per day, no other drink is as healthy. Fruit juice is best enjoyed in a healthy way, by EATING the fruit. Thanks for choosing [facility], we consider it a privelige to serve you.

## 2020-11-24 NOTE — Assessment & Plan Note (Signed)
  Patient re-educated about  the importance of commitment to a  minimum of 150 minutes of exercise per week as able.  The importance of healthy food choices with portion control discussed, as well as eating regularly and within a 12 hour window most days. The need to choose "clean , green" food 50 to 75% of the time is discussed, as well as to make water the primary drink and set a goal of 64 ounces water daily.    Weight /BMI 11/24/2020 11/12/2020 11/12/2020  WEIGHT 187 lb 189 lb 0.6 oz 160 lb  HEIGHT 5\' 3"  5\' 3"  5\' 3"   BMI 33.13 kg/m2 33.49 kg/m2 28.34 kg/m2

## 2020-11-24 NOTE — Assessment & Plan Note (Signed)
Controlled, no change in medication DASH diet and commitment to daily physical activity for a minimum of 30 minutes discussed and encouraged, as a part of hypertension management. The importance of attaining a healthy weight is also discussed.  BP/Weight 11/24/2020 11/12/2020 11/12/2020 06/24/2020 12/12/2019 04/10/8126 04/05/7000  Systolic BP 749 449 675 916 384 665 993  Diastolic BP 84 80 570 82 85 85 80  Wt. (Lbs) 187 189.04 160 193 193 193.3 190  BMI 33.13 33.49 28.34 34.19 34.19 34.24 33.66

## 2020-11-25 ENCOUNTER — Ambulatory Visit: Payer: BC Managed Care – PPO | Admitting: Family Medicine

## 2020-11-25 LAB — CBC
Hematocrit: 35.6 % (ref 34.0–46.6)
Hemoglobin: 11 g/dL — ABNORMAL LOW (ref 11.1–15.9)
MCH: 22 pg — ABNORMAL LOW (ref 26.6–33.0)
MCHC: 30.9 g/dL — ABNORMAL LOW (ref 31.5–35.7)
MCV: 71 fL — ABNORMAL LOW (ref 79–97)
Platelets: 320 10*3/uL (ref 150–450)
RBC: 4.99 x10E6/uL (ref 3.77–5.28)
RDW: 14.5 % (ref 11.7–15.4)
WBC: 7.7 10*3/uL (ref 3.4–10.8)

## 2020-11-25 LAB — LIPID PANEL
Chol/HDL Ratio: 3.1 ratio (ref 0.0–4.4)
Cholesterol, Total: 193 mg/dL (ref 100–199)
HDL: 63 mg/dL (ref >39)
LDL Chol Calc (NIH): 115 mg/dL — ABNORMAL HIGH (ref 0–99)
Triglycerides: 83 mg/dL (ref 0–149)
VLDL Cholesterol Cal: 15 mg/dL (ref 5–40)

## 2020-11-25 LAB — CMP14+EGFR
ALT: 14 IU/L (ref 0–32)
AST: 18 IU/L (ref 0–40)
Albumin/Globulin Ratio: 1.3 (ref 1.2–2.2)
Albumin: 4.6 g/dL (ref 3.8–4.9)
Alkaline Phosphatase: 81 IU/L (ref 44–121)
BUN/Creatinine Ratio: 25 — ABNORMAL HIGH (ref 9–23)
BUN: 15 mg/dL (ref 6–24)
Bilirubin Total: <0.2 mg/dL (ref 0.0–1.2)
CO2: 23 mmol/L (ref 20–29)
Calcium: 9.6 mg/dL (ref 8.7–10.2)
Chloride: 100 mmol/L (ref 96–106)
Creatinine, Ser: 0.61 mg/dL (ref 0.57–1.00)
GFR calc Af Amer: 118 mL/min/{1.73_m2} (ref >59)
GFR calc non Af Amer: 102 mL/min/{1.73_m2} (ref >59)
Globulin, Total: 3.5 g/dL (ref 1.5–4.5)
Glucose: 98 mg/dL (ref 65–99)
Potassium: 4.4 mmol/L (ref 3.5–5.2)
Sodium: 137 mmol/L (ref 134–144)
Total Protein: 8.1 g/dL (ref 6.0–8.5)

## 2020-11-25 LAB — HEMOGLOBIN A1C
Est. average glucose Bld gHb Est-mCnc: 111 mg/dL
Hgb A1c MFr Bld: 5.5 % (ref 4.8–5.6)

## 2020-11-25 LAB — VITAMIN D 25 HYDROXY (VIT D DEFICIENCY, FRACTURES): Vit D, 25-Hydroxy: 37.6 ng/mL (ref 30.0–100.0)

## 2020-11-25 LAB — HEPATITIS C ANTIBODY: Hep C Virus Ab: <0.1 s/co ratio (ref 0.0–0.9)

## 2021-03-13 ENCOUNTER — Other Ambulatory Visit: Payer: Self-pay | Admitting: Family Medicine

## 2021-03-26 ENCOUNTER — Ambulatory Visit: Payer: BC Managed Care – PPO | Admitting: Family Medicine

## 2021-03-26 ENCOUNTER — Encounter: Payer: Self-pay | Admitting: Family Medicine

## 2021-03-26 ENCOUNTER — Other Ambulatory Visit: Payer: Self-pay

## 2021-03-26 VITALS — BP 130/82 | HR 99 | Resp 16 | Ht 63.0 in | Wt 177.0 lb

## 2021-03-26 DIAGNOSIS — L02421 Furuncle of right axilla: Secondary | ICD-10-CM | POA: Diagnosis not present

## 2021-03-26 DIAGNOSIS — E669 Obesity, unspecified: Secondary | ICD-10-CM

## 2021-03-26 DIAGNOSIS — I10 Essential (primary) hypertension: Secondary | ICD-10-CM | POA: Diagnosis not present

## 2021-03-26 DIAGNOSIS — L02429 Furuncle of limb, unspecified: Secondary | ICD-10-CM | POA: Insufficient documentation

## 2021-03-26 MED ORDER — SULFAMETHOXAZOLE-TRIMETHOPRIM 800-160 MG PO TABS: 1.0000 | ORAL_TABLET | Freq: Two times a day (BID) | ORAL | 0 refills | Status: DC

## 2021-03-26 MED ORDER — FLUCONAZOLE 150 MG PO TABS: ORAL_TABLET | ORAL | 0 refills | Status: DC

## 2021-03-26 NOTE — Progress Notes (Signed)
   Kari Mason     MRN: [id number]      DOB: Jan 04, 1966   HPI Kari Mason is here with c/o painful sore under right arm increasing in size, no drainage, fever or chills. Unable to do her job which involves raising her arms, she dpooes shave Left work last night  ROS See HPI  Denies recent fever or chills. Denies sinus pressure, nasal congestion, ear pain or sore throat.   PE  BP 130/82   Pulse 99   Resp 16   Ht 5\' 3"  (1.6 m)   Wt 177 lb (80.3 kg)   LMP 11/16/2018 (Approximate)   SpO2 98%   BMI 31.35 kg/m    Patient alert and oriented and in no cardiopulmonary distress.  HEENT: No facial asymmetry, EOMI,     Neck supple .  Chest: Clear to auscultation bilaterally.  CVS: S1, S2 no murmurs, no S3.Regular rate.  .  Skin: Intact, boil in rigth maxilla at 6 o clock, max diameter approx 3 in, tender, erythema of skin  Psych: Good eye contact, normal affect. Memory intact not anxious or depressed appearing.  CNS: CN 2-12 intact, power,  normal throughout.no focal deficits noted.   Assessment & Plan  Boil, axilla 10 day antibiotic couurse, review next Monday, work excuse to return next Monday   Essential hypertension Controlled, no change in medication DASH diet and commitment to daily physical activity for a minimum of 30 minutes discussed and encouraged, as a part of hypertension management. The importance of attaining a healthy weight is also discussed.  BP/Weight 03/26/2021 11/24/2020 11/12/2020 11/12/2020 06/24/2020 06/13/1504 05/15/7947  Systolic BP 016 553 748 270 786 754 492  Diastolic BP 82 84 80 010 82 85 85  Wt. (Lbs) 177 187 189.04 160 193 193 193.3  BMI 31.35 33.13 33.49 28.34 34.19 34.19 34.24       Obesity (BMI 30.0-34.9) Improved  Patient re-educated about  the importance of commitment to a  minimum of 150 minutes of exercise per week as able.  The importance of healthy food choices with portion control discussed, as well as eating regularly and  within a 12 hour window most days. The need to choose "clean , green" food 50 to 75% of the time is discussed, as well as to make water the primary drink and set a goal of 64 ounces water daily.    Weight /BMI 03/26/2021 11/24/2020 11/12/2020  WEIGHT 177 lb 187 lb 189 lb 0.6 oz  HEIGHT 5\' 3"  5\' 3"  5\' 3"   BMI 31.35 kg/m2 33.13 kg/m2 33.49 kg/m2

## 2021-03-26 NOTE — Assessment & Plan Note (Signed)
Controlled, no change in medication DASH diet and commitment to daily physical activity for a minimum of 30 minutes discussed and encouraged, as a part of hypertension management. The importance of attaining a healthy weight is also discussed.  BP/Weight 03/26/2021 11/24/2020 11/12/2020 11/12/2020 06/24/2020 8/0/8811 0/02/1593  Systolic BP 585 929 244 628 638 177 116  Diastolic BP 82 84 80 579 82 85 85  Wt. (Lbs) 177 187 189.04 160 193 193 193.3  BMI 31.35 33.13 33.49 28.34 34.19 34.19 34.24

## 2021-03-26 NOTE — Assessment & Plan Note (Signed)
10 day antibiotic couurse, review next Monday, work excuse to return next Monday

## 2021-03-26 NOTE — Patient Instructions (Signed)
F/U in office with mD on 03/30/2021  Antibiotic and tablet for vaginal yeast infection is prescribed, you are treated for a boil in your right arm pit  Work excuse from today to return on 03/30/2021  Thanks for choosing [facility], we consider it a privelige to serve you.

## 2021-03-26 NOTE — Assessment & Plan Note (Signed)
Improved  Patient re-educated about  the importance of commitment to a  minimum of 150 minutes of exercise per week as able.  The importance of healthy food choices with portion control discussed, as well as eating regularly and within a 12 hour window most days. The need to choose "clean , green" food 50 to 75% of the time is discussed, as well as to make water the primary drink and set a goal of 64 ounces water daily.    Weight /BMI 03/26/2021 11/24/2020 11/12/2020  WEIGHT 177 lb 187 lb 189 lb 0.6 oz  HEIGHT 5\' 3"  5\' 3"  5\' 3"   BMI 31.35 kg/m2 33.13 kg/m2 33.49 kg/m2

## 2021-03-27 ENCOUNTER — Telehealth: Payer: Self-pay

## 2021-03-27 NOTE — Telephone Encounter (Signed)
fmla forms received  Copied Noted Sleeved  

## 2021-03-30 ENCOUNTER — Encounter: Payer: Self-pay | Admitting: Family Medicine

## 2021-03-30 ENCOUNTER — Other Ambulatory Visit: Payer: Self-pay

## 2021-03-30 ENCOUNTER — Ambulatory Visit: Payer: BC Managed Care – PPO | Admitting: Family Medicine

## 2021-03-30 VITALS — BP 123/78 | HR 85 | Resp 15 | Ht 63.0 in | Wt 176.1 lb

## 2021-03-30 DIAGNOSIS — L02421 Furuncle of right axilla: Secondary | ICD-10-CM | POA: Diagnosis not present

## 2021-03-30 DIAGNOSIS — I10 Essential (primary) hypertension: Secondary | ICD-10-CM | POA: Diagnosis not present

## 2021-03-30 NOTE — Assessment & Plan Note (Signed)
Controlled, no change in medication  

## 2021-03-30 NOTE — Progress Notes (Signed)
   Kari Mason     MRN: [id number]      DOB: Jan 10, 1966   HPI Kari Mason is here for follow up of axillary abscess, no significant improvement , no drainage, despite oral antibiotic Denies fever or chills, limitation in right arm movement persists ROS See HPI Denies recent fever or chills.   PE  BP 123/78   Pulse 85   Resp 15   Ht 5\' 3"  (1.6 m)   Wt 176 lb 1.9 oz (79.9 kg)   LMP 11/16/2018 (Approximate)   SpO2 97%   BMI 31.20 kg/m   Patient alert and oriented and in no cardiopulmonary distress.  HEENT: No facial asymmetry, EOMI,     Neck supple .  Chest: Clear to auscultation bilaterally.  CVS: S1, S2 no murmurs, no S3.Regular rate.  ABD: Soft non tender.   Ext: No edema  MS: decreased rOM RUE Skin: erythema and ab cess right axilla, non tender  Psych: Good eye contact, normal affect. Memory intact not anxious or depressed appearing.  CNS: CN 2-12 intact, power,  normal throughout.no focal deficits noted.   Assessment & Plan  Boil, axilla Persistent right axillary boil, despite oral atibiotic, no drainage, and max diameter, approx 12 cm and deep, refer urgently to surgery, needs to be evaluated within 24 hours  Essential hypertension Controlled, no change in medication

## 2021-03-30 NOTE — Patient Instructions (Addendum)
F/U in May as before, call if you need me sooner  Urgent appointment with general Surgery needed  Work excuse form today to return May 3 , or AS DETERMINED by general srgery

## 2021-03-30 NOTE — Assessment & Plan Note (Signed)
Persistent right axillary boil, despite oral atibiotic, no drainage, and max diameter, approx 12 cm and deep, refer urgently to surgery, needs to be evaluated within 24 hours

## 2021-03-31 ENCOUNTER — Encounter: Payer: Self-pay | Admitting: General Surgery

## 2021-03-31 ENCOUNTER — Ambulatory Visit: Payer: BC Managed Care – PPO | Admitting: General Surgery

## 2021-03-31 VITALS — BP 133/83 | HR 85 | Temp 98.3°F | Resp 14 | Ht 63.0 in | Wt 178.0 lb

## 2021-03-31 DIAGNOSIS — L02411 Cutaneous abscess of right axilla: Secondary | ICD-10-CM

## 2021-03-31 NOTE — Progress Notes (Signed)
Kari Mason; [id number]; 1966-10-13   HPI Patient is a 55 year old black female who was referred to my care by Dr. Moshe Mason for evaluation and treatment of a right axillary abscess.  She was seen by Dr. Moshe Mason 5 days ago and started on Bactrim for a boil in the right axilla.  Patient was referred to my care for possible incision and drainage of the boil.  The patient woke up this morning with cloudy yellow drainage on her gown.  She states it started draining overnight.  She denies any fever or chills. Past Medical History:  Diagnosis Date  . Allergy   . GERD (gastroesophageal reflux disease)   . Hypertension     Past Surgical History:  Procedure Laterality Date  . COLONOSCOPY N/A 10/15/2015   Procedure: COLONOSCOPY;  Surgeon: Kari Houston, MD;  Location: [facility];  Service: Endoscopy;  Laterality: N/A;  1200  . COLONOSCOPY N/A 12/07/2018   Procedure: COLONOSCOPY;  Surgeon: Kari Houston, MD;  Location: [facility];  Service: Endoscopy;  Laterality: N/A;  1:45  . TUBAL LIGATION      Family History  Problem Relation Age of Onset  . Hypertension Father   . Diabetes Sister   . Hypertension Sister     Current Outpatient Medications on File Prior to Visit  Medication Sig Dispense Refill  . diclofenac (VOLTAREN) 75 MG EC tablet Take one tablet once daily, as needed, for left knee pain 30 tablet 1  . Multiple Vitamin (MULTIVITAMIN WITH MINERALS) TABS tablet Take 1 tablet by mouth daily.    . ondansetron (ZOFRAN) 4 MG tablet Take 1 tablet (4 mg total) by mouth every 8 (eight) hours as needed for nausea or vomiting. 20 tablet 0  . pantoprazole (PROTONIX) 40 MG tablet Take 1 tablet (40 mg total) by mouth daily. 30 tablet 3  . sulfamethoxazole-trimethoprim (BACTRIM DS) 800-160 MG tablet Take 1 tablet by mouth 2 (two) times daily. 20 tablet 0  . triamterene-hydrochlorothiazide (MAXZIDE-25) 37.5-25 MG tablet Take 1 tablet by mouth once daily 30 tablet 0   No current  facility-administered medications on file prior to visit.    Allergies  Allergen Reactions  . Lisinopril Cough    Social History   Substance and Sexual Activity  Alcohol Use No    Social History   Tobacco Use  Smoking Status Never Smoker  Smokeless Tobacco Never Used    Review of Systems  Constitutional: Negative.   HENT: Negative.   Eyes: Negative.   Respiratory: Negative.   Cardiovascular: Negative.   Gastrointestinal: Negative.   Genitourinary: Negative.   Musculoskeletal: Negative.   Skin: Negative.   Neurological: Negative.   Endo/Heme/Allergies: Negative.   Psychiatric/Behavioral: Negative.     Objective   Vitals:   03/31/21 1425  BP: 133/83  Pulse: 85  Resp: 14  Temp: 98.3 F (36.8 C)  SpO2: 99%    Physical Exam Vitals reviewed.  Constitutional:      Appearance: Normal appearance. She is not ill-appearing.  HENT:     Head: Normocephalic and atraumatic.  Cardiovascular:     Rate and Rhythm: Normal rate and regular rhythm.     Heart sounds: Normal heart sounds. No murmur heard. No friction rub. No gallop.   Pulmonary:     Effort: Pulmonary effort is normal. No respiratory distress.     Breath sounds: Normal breath sounds. No stridor. No wheezing, rhonchi or rales.  Skin:    General: Skin is warm and dry.  Comments: Right axilla with a draining boil present along the mid axillary line inferiorly.  Mild erythema is noted.  I was not able to assess for lymphadenopathy due to the swelling.  Neurological:     Mental Status: She is alert and oriented to person, place, and time.     Assessment  Resolving right axillary abscess.  The patient was hesitant on any further incision in that area, which was fine with me as it is starting to drain. Plan   Continue antibiotic regimen.  Keep right axilla clean and dry with soap and water twice a day.  Try to express as much purulent fluid as you can.  I told her I be more than happy to see her in 2 days  for follow-up should things get worse, or in 1 week.  She is seeing Dr. Moshe Mason in 1 week for follow-up.

## 2021-03-31 NOTE — Patient Instructions (Signed)
Keep right underarm clean with soap and water twice a day.  No antiperspirant to right underarm.  Return either 4/28 in the morning or 5/3 in the afternoon if it gets worse.

## 2021-04-07 ENCOUNTER — Encounter: Payer: Self-pay | Admitting: Family Medicine

## 2021-04-07 ENCOUNTER — Other Ambulatory Visit: Payer: Self-pay

## 2021-04-07 ENCOUNTER — Ambulatory Visit: Payer: BC Managed Care – PPO | Admitting: Family Medicine

## 2021-04-07 ENCOUNTER — Telehealth: Payer: Self-pay

## 2021-04-07 VITALS — BP 138/84 | HR 100 | Resp 16 | Ht 63.0 in | Wt 181.1 lb

## 2021-04-07 DIAGNOSIS — E669 Obesity, unspecified: Secondary | ICD-10-CM

## 2021-04-07 DIAGNOSIS — L02421 Furuncle of right axilla: Secondary | ICD-10-CM

## 2021-04-07 DIAGNOSIS — Z1231 Encounter for screening mammogram for malignant neoplasm of breast: Secondary | ICD-10-CM

## 2021-04-07 DIAGNOSIS — I10 Essential (primary) hypertension: Secondary | ICD-10-CM

## 2021-04-07 DIAGNOSIS — R7302 Impaired glucose tolerance (oral): Secondary | ICD-10-CM

## 2021-04-07 MED ORDER — SULFAMETHOXAZOLE-TRIMETHOPRIM 800-160 MG PO TABS: 1.0000 | ORAL_TABLET | Freq: Two times a day (BID) | ORAL | 0 refills | Status: DC

## 2021-04-07 MED ORDER — FLUCONAZOLE 150 MG PO TABS: ORAL_TABLET | ORAL | 0 refills | Status: DC

## 2021-04-07 NOTE — Progress Notes (Signed)
   Kari Mason     MRN: [id number]      DOB: [birth year]   HPI Kari Mason is here for follow up of boil in right armpit. Gradually improving and started draining spontaneously the day after her last visit here when she saw surgery. No incision made by surgeon, additional pressure applied and advised prn return Feels better though still draining. No fevr or chills, and improved ability to use the arm. Skin is still open.  ROS Denies recent fever or chills. Denies sinus pressure, nasal congestion, ear pain or sore throat. Denies chest congestion, productive cough or wheezing. Denies chest pains, palpitations and leg swelling Denies abdominal pain, nausea, vomiting,diarrhea or constipation.   Denies dysuria, frequency, hesitancy or incontinence. Denies joint pain, swelling and limitation in mobility. Denies headaches, seizures, numbness, or tingling. Denies depression, anxiety or insomnia.  PE  BP 138/84   Pulse 100   Resp 16   Ht 5\' 3"  (1.6 m)   Wt 181 lb 1.9 oz (82.2 kg)   LMP 11/16/2018 (Approximate)   SpO2 98%   BMI 32.08 kg/m   Patient alert and oriented and in no cardiopulmonary distress.  HEENT: No facial asymmetry, EOMI,     Neck supple .  Chest: Clear to auscultation bilaterally.  CVS: S1, S2 no murmurs, no S3.Regular rate.  ABD: Soft non tender.   Ext: No edema  MS: Adequate ROM spine, , hips and knees. And LUE.  reduced in RUE   Skin: Marked reduction in size of boil in right armpit, no redness or warmth of skin , max diameter approx 7 cm, slight tenderness with direct pressure, skin open and draining Psych: Good eye contact, normal affect. Memory intact not anxious or depressed appearing.  CNS: CN 2-12 intact, power,  normal throughout.no focal deficits noted.   Assessment & Plan  Furuncle of axilla Currently drining, marked improvement, not completely resolved Additional 1 week of septra is prescribed. Return to work 04/13/2021 with no  restrictions  Essential hypertension .coon1 \ DASH diet and commitment to daily physical activity for a minimum of 30 minutes discussed and encouraged, as a part of hypertension management. The importance of attaining a healthy weight is also discussed.  BP/Weight 04/07/2021 03/31/2021 03/30/2021 03/26/2021 11/24/2020 11/12/2020 54/01/7061  Systolic BP 376 283 151 761 607 371 062  Diastolic BP 84 83 78 82 84 80 101  Wt. (Lbs) 181.12 178 176.12 177 187 189.04 160  BMI 32.08 31.53 31.2 31.35 33.13 33.49 28.34       Obesity (BMI 30.0-34.9)  Patient re-educated about  the importance of commitment to a  minimum of 150 minutes of exercise per week as able.  The importance of healthy food choices with portion control discussed, as well as eating regularly and within a 12 hour window most days. The need to choose "clean , green" food 50 to 75% of the time is discussed, as well as to make water the primary drink and set a goal of 64 ounces water daily.    Weight /BMI 04/07/2021 03/31/2021 03/30/2021  WEIGHT 181 lb 1.9 oz 178 lb 176 lb 1.9 oz  HEIGHT 5\' 3"  5\' 3"  5\' 3"   BMI 32.08 kg/m2 31.53 kg/m2 31.2 kg/m2

## 2021-04-07 NOTE — Patient Instructions (Addendum)
Annual exam end August, or early [place], call if you need me sooner  Additional antibiotic course prescribed  Return to work with no restrictions 04/13/2021  Fasting lipid, chem 7 and EEGFR, TSH 5 days before next vsiit Please schedule mammogram July 9 or after   It is important that you exercise regularly at least 30 minutes 5 times a week. If you develop chest pain, have severe difficulty breathing, or feel very tired, stop exercising immediately and seek medical attention   Think about what you will eat, plan ahead. Choose " clean, green, fresh or frozen" over canned, processed or packaged foods which are more sugary, salty and fatty. 70 to 75% of food eaten should be vegetables and fruit. Three meals at set times with snacks allowed between meals, but they must be fruit or vegetables. Aim to eat over a 12 hour period , example 7 am to 7 pm, and STOP after  your last meal of the day. Drink water,generally about 64 ounces per day, no other drink is as healthy. Fruit juice is best enjoyed in a healthy way, by EATING the fruit.

## 2021-04-07 NOTE — Assessment & Plan Note (Signed)
.  coon1 \ DASH diet and commitment to daily physical activity for a minimum of 30 minutes discussed and encouraged, as a part of hypertension management. The importance of attaining a healthy weight is also discussed.  BP/Weight 04/07/2021 03/31/2021 03/30/2021 03/26/2021 11/24/2020 11/12/2020 46/05/5992  Systolic BP 570 177 939 030 092 330 076  Diastolic BP 84 83 78 82 84 80 101  Wt. (Lbs) 181.12 178 176.12 177 187 189.04 160  BMI 32.08 31.53 31.2 31.35 33.13 33.49 28.34

## 2021-04-07 NOTE — Assessment & Plan Note (Addendum)
Currently drining, marked improvement, not completely resolved Additional 1 week of septra is prescribed. Return to work 04/13/2021 with no restrictions

## 2021-04-07 NOTE — Assessment & Plan Note (Signed)
  Patient re-educated about  the importance of commitment to a  minimum of 150 minutes of exercise per week as able.  The importance of healthy food choices with portion control discussed, as well as eating regularly and within a 12 hour window most days. The need to choose "clean , green" food 50 to 75% of the time is discussed, as well as to make water the primary drink and set a goal of 64 ounces water daily.    Weight /BMI 04/07/2021 03/31/2021 03/30/2021  WEIGHT 181 lb 1.9 oz 178 lb 176 lb 1.9 oz  HEIGHT 5\' 3"  5\' 3"  5\' 3"   BMI 32.08 kg/m2 31.53 kg/m2 31.2 kg/m2

## 2021-04-07 NOTE — Telephone Encounter (Signed)
FMLA forms received  Copied Noted Sleeved

## 2021-04-09 ENCOUNTER — Other Ambulatory Visit: Payer: Self-pay | Admitting: Family Medicine

## 2021-04-09 DIAGNOSIS — Z0279 Encounter for issue of other medical certificate: Secondary | ICD-10-CM

## 2021-04-09 NOTE — Telephone Encounter (Signed)
Complete

## 2021-05-21 ENCOUNTER — Other Ambulatory Visit: Payer: Self-pay | Admitting: Family Medicine

## 2021-06-22 ENCOUNTER — Other Ambulatory Visit: Payer: Self-pay | Admitting: Family Medicine

## 2021-07-20 ENCOUNTER — Other Ambulatory Visit: Payer: Self-pay | Admitting: Family Medicine

## 2021-07-20 DIAGNOSIS — Z1231 Encounter for screening mammogram for malignant neoplasm of breast: Secondary | ICD-10-CM

## 2021-07-27 ENCOUNTER — Other Ambulatory Visit: Payer: Self-pay | Admitting: Family Medicine

## 2021-07-29 ENCOUNTER — Other Ambulatory Visit: Payer: Self-pay

## 2021-07-29 ENCOUNTER — Ambulatory Visit
Admission: RE | Admit: 2021-07-29 | Discharge: 2021-07-29 | Disposition: A | Discharge status: Home / Self Care | Payer: BC Managed Care – PPO | Source: Ambulatory Visit | Attending: Family Medicine | Admitting: Family Medicine

## 2021-07-29 DIAGNOSIS — Z1231 Encounter for screening mammogram for malignant neoplasm of breast: Secondary | ICD-10-CM

## 2021-08-05 ENCOUNTER — Encounter: Payer: BC Managed Care – PPO | Admitting: Family Medicine

## 2021-08-12 ENCOUNTER — Other Ambulatory Visit: Payer: Self-pay

## 2021-08-12 ENCOUNTER — Other Ambulatory Visit (HOSPITAL_COMMUNITY)
Admission: RE | Admit: 2021-08-12 | Discharge: 2021-08-12 | Disposition: A | Discharge status: Home / Self Care | Payer: BC Managed Care – PPO | Source: Ambulatory Visit | Attending: Family Medicine | Admitting: Family Medicine

## 2021-08-12 ENCOUNTER — Ambulatory Visit (INDEPENDENT_AMBULATORY_CARE_PROVIDER_SITE_OTHER): Payer: BC Managed Care – PPO | Admitting: Family Medicine

## 2021-08-12 VITALS — BP 141/79 | HR 87 | Temp 98.4°F | Resp 18 | Ht 63.0 in | Wt 183.0 lb

## 2021-08-12 DIAGNOSIS — Z124 Encounter for screening for malignant neoplasm of cervix: Secondary | ICD-10-CM | POA: Insufficient documentation

## 2021-08-12 DIAGNOSIS — Z0000 Encounter for general adult medical examination without abnormal findings: Secondary | ICD-10-CM | POA: Diagnosis not present

## 2021-08-12 DIAGNOSIS — I10 Essential (primary) hypertension: Secondary | ICD-10-CM | POA: Diagnosis not present

## 2021-08-12 MED ORDER — TRIAMTERENE-HCTZ 75-50 MG PO TABS: 1.0000 | ORAL_TABLET | Freq: Every day | ORAL | 3 refills | Status: DC

## 2021-08-12 NOTE — Progress Notes (Signed)
    Kari Mason     MRN: [id number]      DOB: Aug 01, 1966  HPI: Patient is in for annual physical exam. Uncontrolled hypertension is addressed at the visit. Recent labs,  are reviewed. Immunization is reviewed , and  pt declines .   PE: BP (!) 141/79   Pulse 87   Temp 98.4 F (36.9 C)   Resp 18   Ht '5\' 3"'$  (1.6 m)   Wt 183 lb 0.6 oz (83 kg)   LMP 11/16/2018 (Approximate)   SpO2 98%   BMI 32.42 kg/m   Pleasant  female, alert and oriented x 3, in no cardio-pulmonary distress. Afebrile. HEENT No facial trauma or asymetry. Sinuses non tender.  Extra occullar muscles intact.. External ears normal, . Neck: supple, no adenopathy,JVD or thyromegaly.No bruits.  Chest: Clear to ascultation bilaterally.No crackles or wheezes. Non tender to palpation  Breast: No asymetry,no masses or lumps. No tenderness. No nipple discharge or inversion. No axillary or supraclavicular adenopathy  Cardiovascular system; Heart sounds normal,  S1 and  S2 ,no S3.  No murmur, or thrill. Apical beat not displaced Peripheral pulses normal.  Abdomen: Soft, non tender, no organomegaly or masses. No bruits. Bowel sounds normal. No guarding, tenderness or rebound.   GU: External genitalia normal female genitalia , normal female distribution of hair. No lesions. Urethral meatus normal in size, no  Prolapse, no lesions visibly  Present. Bladder non tender. Vagina pink and moist , with no visible lesions , discharge present . Adequate pelvic support no  cystocele or rectocele noted Cervix pink and appears healthy, no lesions or ulcerations noted, no discharge noted from os Uterus normal size, no adnexal masses, no cervical motion or adnexal tenderness.   Musculoskeletal exam: Full ROM of spine, hips , shoulders and knees. No deformity ,swelling or crepitus noted. No muscle wasting or atrophy.   Neurologic: Cranial nerves 2 to 12 intact. Power, tone ,sensation and reflexes normal  throughout. No disturbance in gait. No tremor.  Skin: Intact, no ulceration, erythema , scaling or rash noted. Pigmentation normal throughout  Psych; Normal mood and affect. Judgement and concentration normal   Assessment & Plan:  Annual physical exam Annual exam as documented. Counseling done  re healthy lifestyle involving commitment to 150 minutes exercise per week, heart healthy diet, and attaining healthy weight.The importance of adequate sleep also discussed. Regular seat belt use and home safety, is also discussed. Changes in health habits are decided on by the patient with goals and time frames  set for achieving them. Immunization and cancer screening needs are specifically addressed at this visit.   Essential hypertension Uncontrolled Increase dose of triamterene DASH diet and commitment to daily physical activity for a minimum of 30 minutes discussed and encouraged, as a part of hypertension management. The importance of attaining a healthy weight is also discussed.  BP/Weight 08/12/2021 04/07/2021 03/31/2021 03/30/2021 03/26/2021 11/24/2020 [id number]  Systolic BP Q000111Q 0000000 Q000111Q [id number] Q000111Q 0000000  Diastolic BP 79 84 83 78 82 84 80  Wt. (Lbs) 183.04 181.12 178 176.12 177 187 189.04  BMI 32.42 32.08 31.53 31.2 31.35 33.13 33.49

## 2021-08-12 NOTE — Patient Instructions (Addendum)
F/u in 8 to 10 weeks, re evaluate blood pressure , call if you need me sooner  Pap sent today  Please get fasting labs this week  Reconsider A:LL vaccines please  Blood pressure is above goal, NEW HIGHER DOSE TABLET, Maxzide 75/50 ONE daily  It is important that you exercise regularly at least 30 minutes 5 times a week. If you develop chest pain, have severe difficulty breathing, or feel very tired, stop exercising immediately and seek medical attention  Think about what you will eat, plan ahead. Choose " clean, green, fresh or frozen" over canned, processed or packaged foods which are more sugary, salty and fatty. 70 to 75% of food eaten should be vegetables and fruit. Three meals at set times with snacks allowed between meals, but they must be fruit or vegetables. Aim to eat over a 12 hour period , example 7 am to 7 pm, and STOP after  your last meal of the day. Drink water,generally about 64 ounces per day, no other drink is as healthy. Fruit juice is best enjoyed in a healthy way, by EATING the fruit. Thanks for choosing [facility], we consider it a privelige to serve you.

## 2021-08-13 ENCOUNTER — Encounter: Payer: Self-pay | Admitting: Family Medicine

## 2021-08-13 NOTE — Assessment & Plan Note (Signed)
Uncontrolled Increase dose of triamterene DASH diet and commitment to daily physical activity for a minimum of 30 minutes discussed and encouraged, as a part of hypertension management. The importance of attaining a healthy weight is also discussed.  BP/Weight 08/12/2021 04/07/2021 03/31/2021 03/30/2021 03/26/2021 11/24/2020 [id number]  Systolic BP Q000111Q 0000000 Q000111Q [id number] Q000111Q 0000000  Diastolic BP 79 84 83 78 82 84 80  Wt. (Lbs) 183.04 181.12 178 176.12 177 187 189.04  BMI 32.42 32.08 31.53 31.2 31.35 33.13 33.49

## 2021-08-13 NOTE — Assessment & Plan Note (Signed)

## 2021-08-20 LAB — CYTOLOGY - PAP
Comment: NEGATIVE
Diagnosis: NEGATIVE
Diagnosis: REACTIVE
High risk HPV: NEGATIVE

## 2021-10-01 IMAGING — MG MM DIGITAL SCREENING BILAT W/ TOMO AND CAD
8 series · 8 of 24 positions shown · non-contrast
Comparison: Previous exam(s).

CLINICAL DATA: Screening.

EXAM:
DIGITAL SCREENING BILATERAL MAMMOGRAM WITH TOMOSYNTHESIS AND CAD
TECHNIQUE: Bilateral screening digital craniocaudal and mediolateral oblique
mammograms were obtained. Bilateral screening digital breast
tomosynthesis was performed. The images were evaluated with
computer-aided detection.

[L MLO synth-2D]
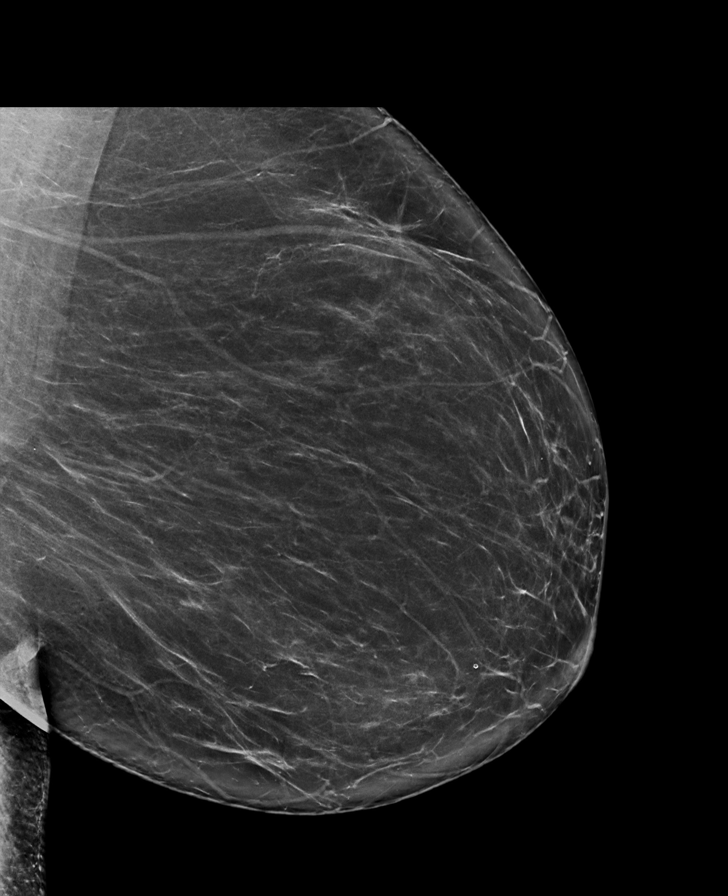

[L CC synth-2D]
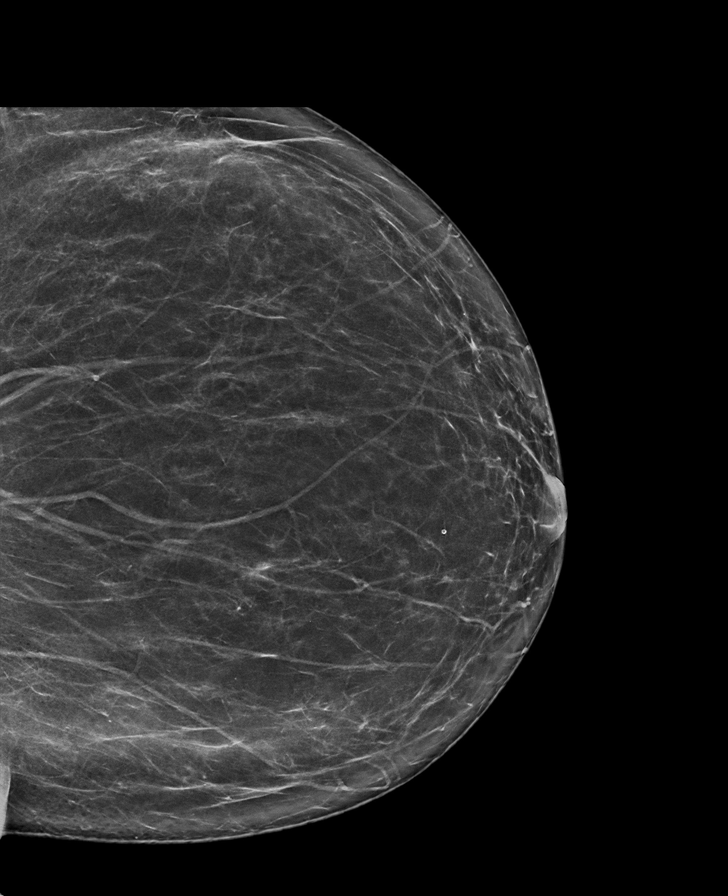

[R MLO synth-2D]
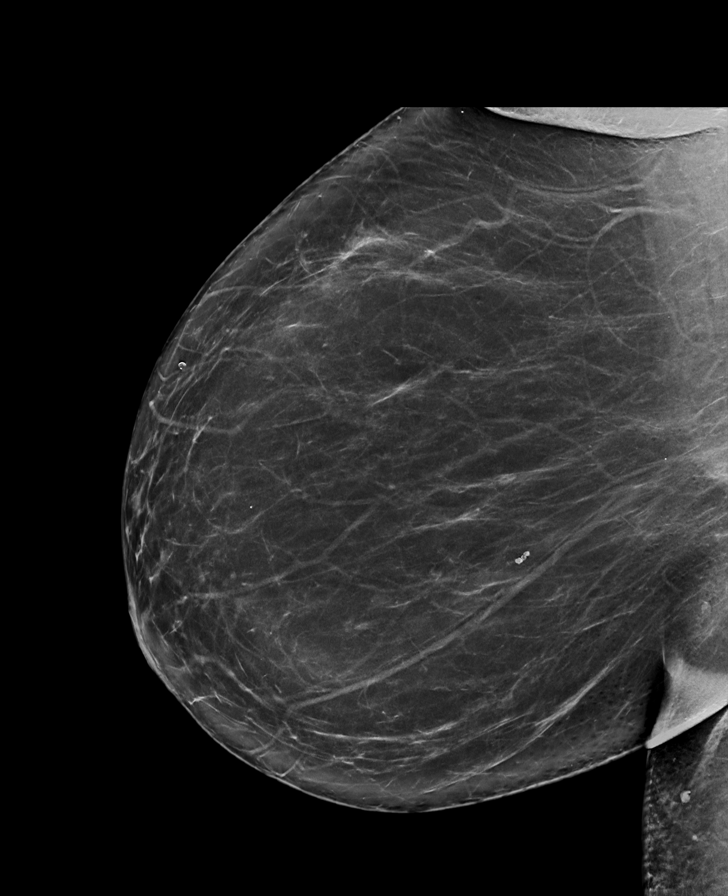

[R CC synth-2D]
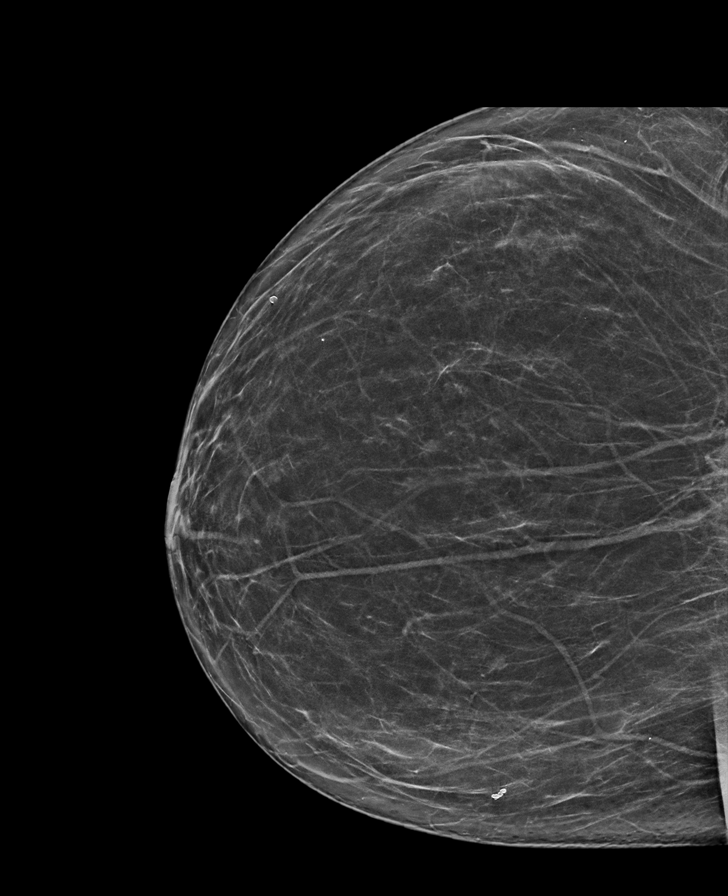

[R CC tomo · tomo slice 39/76.0]
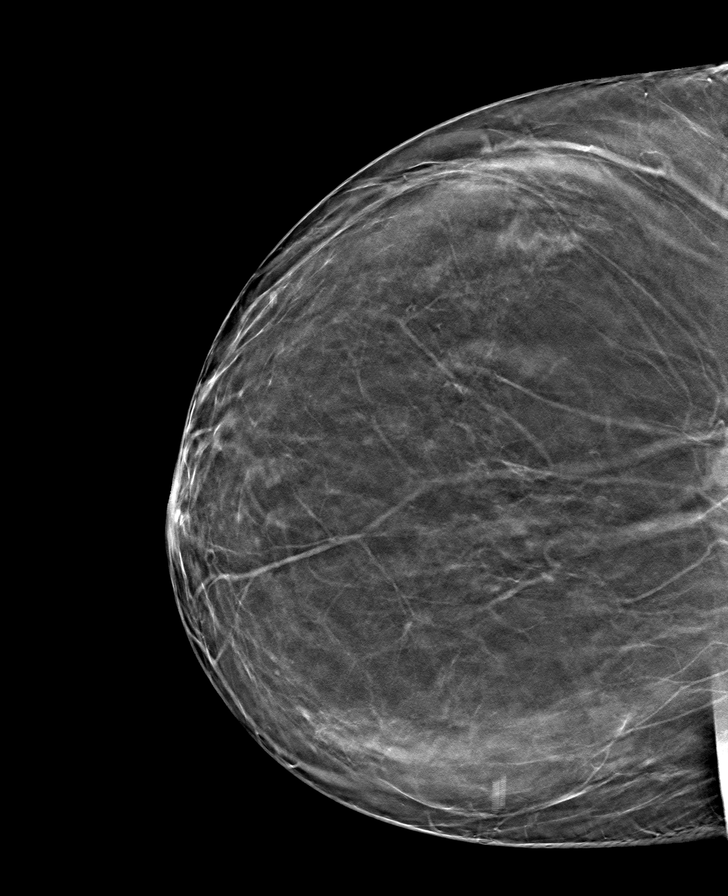

[L MLO tomo · tomo slice 45/89.0]
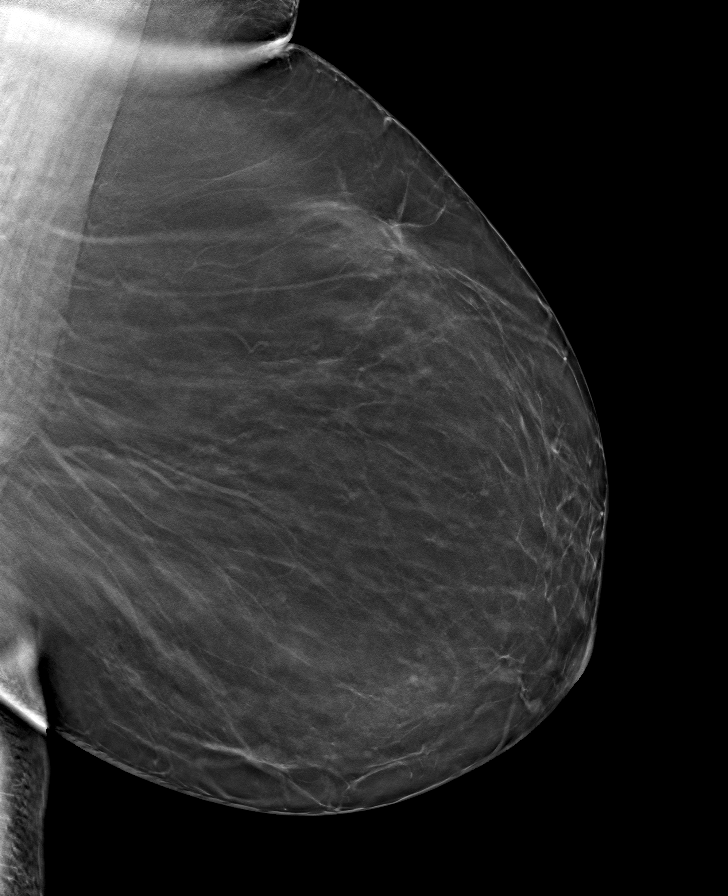

[R MLO tomo · tomo slice 45/90.0]
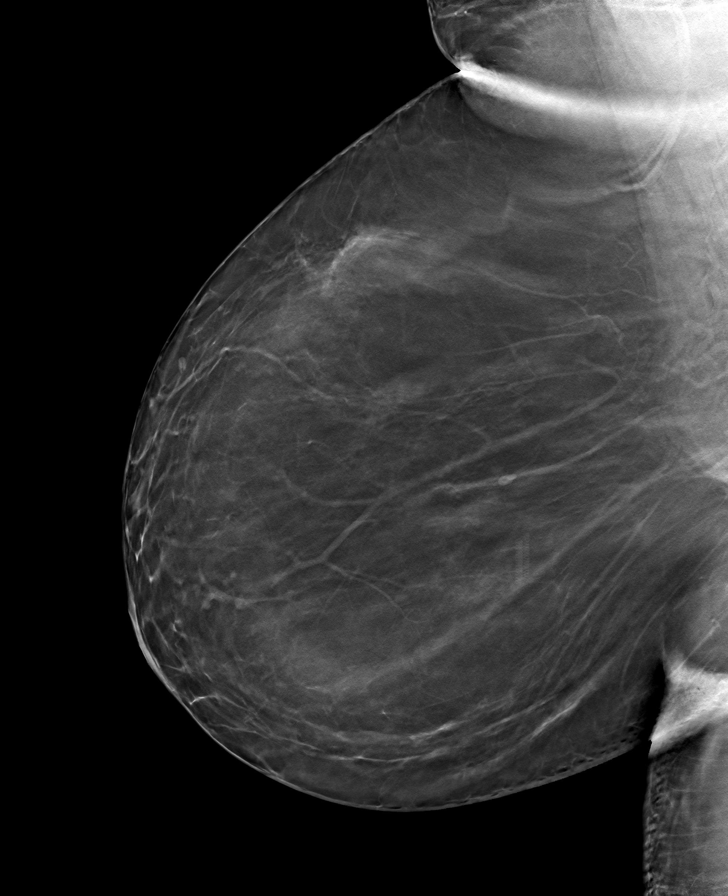

[L CC tomo · tomo slice 40/79.0]
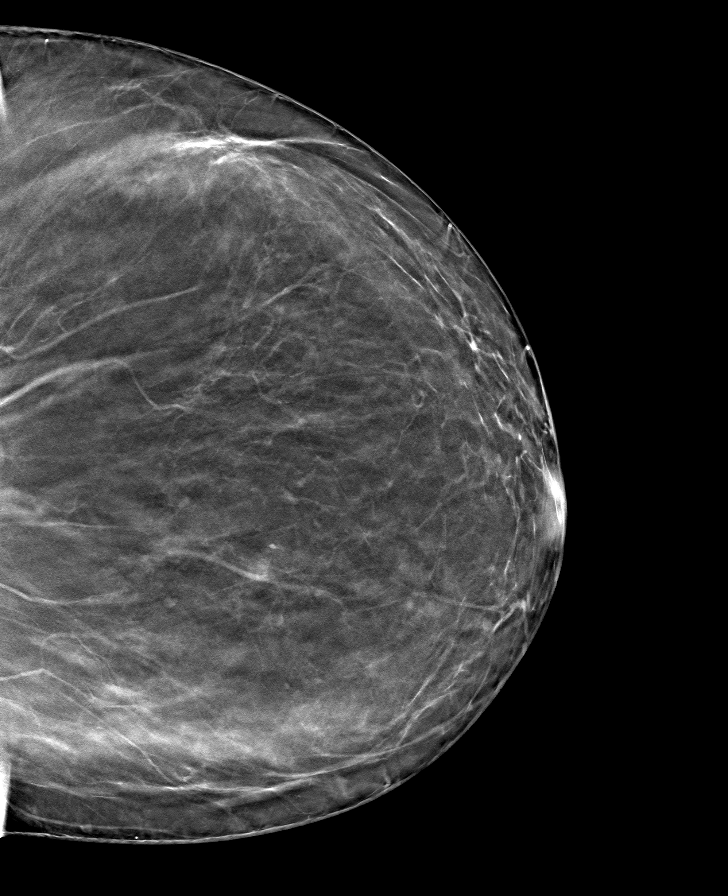

[8 of 24 positions shown; findings below may reference images not displayed]

ACR Breast Density Category b: There are scattered areas of
fibroglandular density.
FINDINGS: There are no findings suspicious for malignancy.
IMPRESSION: No mammographic evidence of malignancy. A result letter of this
screening mammogram will be mailed directly to the patient.

RECOMMENDATION:
Screening mammogram in one year. (Code:51-O-LD2)

BI-RADS CATEGORY  1: Negative.

## 2021-10-13 ENCOUNTER — Ambulatory Visit: Payer: BC Managed Care – PPO | Admitting: Family Medicine

## 2021-10-15 ENCOUNTER — Other Ambulatory Visit: Payer: Self-pay

## 2021-10-15 ENCOUNTER — Ambulatory Visit: Payer: BC Managed Care – PPO | Admitting: Family Medicine

## 2021-10-15 ENCOUNTER — Encounter: Payer: Self-pay | Admitting: Family Medicine

## 2021-10-15 VITALS — BP 136/82 | HR 88 | Resp 17 | Ht 63.0 in | Wt 184.0 lb

## 2021-10-15 DIAGNOSIS — R7302 Impaired glucose tolerance (oral): Secondary | ICD-10-CM

## 2021-10-15 DIAGNOSIS — E559 Vitamin D deficiency, unspecified: Secondary | ICD-10-CM | POA: Diagnosis not present

## 2021-10-15 DIAGNOSIS — I10 Essential (primary) hypertension: Secondary | ICD-10-CM

## 2021-10-15 DIAGNOSIS — E669 Obesity, unspecified: Secondary | ICD-10-CM | POA: Diagnosis not present

## 2021-10-15 DIAGNOSIS — Z13220 Encounter for screening for lipoid disorders: Secondary | ICD-10-CM

## 2021-10-15 NOTE — Patient Instructions (Addendum)
F/U in 6 months, call if you need me before  cBC, Fasting lipid, cmp and eGFr, HBa1C, TSH past due please get asap  Weight loss goal; of 10 pounds  No med changes.  Best for 2023, be careful!  It is important that you exercise regularly at least 30 minutes 5 times a week. If you develop chest pain, have severe difficulty breathing, or feel very tired, stop exercising immediately and seek medical attention   Thanks for choosing [facility], we consider it a privelige to serve you.

## 2021-10-17 ENCOUNTER — Encounter: Payer: Self-pay | Admitting: Family Medicine

## 2021-10-17 NOTE — Assessment & Plan Note (Signed)
Controlled, no change in medication DASH diet and commitment to daily physical activity for a minimum of 30 minutes discussed and encouraged, as a part of hypertension management. The importance of attaining a healthy weight is also discussed.  BP/Weight 10/15/2021 08/12/2021 04/07/2021 03/31/2021 03/30/2021 03/26/2021 38/46/6599  Systolic BP 357 017 793 903 009 233 007  Diastolic BP 82 79 84 83 78 82 84  Wt. (Lbs) 184.04 183.04 181.12 178 176.12 177 187  BMI 32.6 32.42 32.08 31.53 31.2 31.35 33.13

## 2021-10-17 NOTE — Assessment & Plan Note (Signed)
  Patient re-educated about  the importance of commitment to a  minimum of 150 minutes of exercise per week as able.  The importance of healthy food choices with portion control discussed, as well as eating regularly and within a 12 hour window most days. The need to choose "clean , green" food 50 to 75% of the time is discussed, as well as to make water the primary drink and set a goal of 64 ounces water daily.    Weight /BMI 10/15/2021 08/12/2021 04/07/2021  WEIGHT 184 lb 0.6 oz 183 lb 0.6 oz 181 lb 1.9 oz  HEIGHT 5\' 3"  5\' 3"  5\' 3"   BMI 32.6 kg/m2 32.42 kg/m2 32.08 kg/m2

## 2021-10-17 NOTE — Progress Notes (Signed)
   Kari Mason     MRN: [id number]      DOB: 10/13/1966   HPI Kari Mason is here for follow up and re-evaluation of chronic medical conditions, medication management and review of any available recent lab and radiology data.  Preventive health is updated, specifically  Cancer screening and Immunization.   Questions or concerns regarding consultations or procedures which the PT has had in the interim are  addressed. The PT denies any adverse reactions to current medications since the last visit.  There are no new concerns.  There are no specific complaints   ROS Denies recent fever or chills. Denies sinus pressure, nasal congestion, ear pain or sore throat. Denies chest congestion, productive cough or wheezing. Denies chest pains, palpitations and leg swelling Denies abdominal pain, nausea, vomiting,diarrhea or constipation.   Denies dysuria, frequency, hesitancy or incontinence. Denies joint pain, swelling and limitation in mobility. Denies headaches, seizures, numbness, or tingling. Denies depression, anxiety or insomnia. Denies skin break down or rash.   PE  BP 136/82   Pulse 88   Resp 17   Ht 5\' 3"  (1.6 m)   Wt 184 lb 0.6 oz (83.5 kg)   LMP 11/16/2018 (Approximate)   SpO2 98%   BMI 32.60 kg/m   Patient alert and oriented and in no cardiopulmonary distress.  HEENT: No facial asymmetry, EOMI,     Neck supple .  Chest: Clear to auscultation bilaterally.  CVS: S1, S2 no murmurs, no S3.Regular rate.  ABD: Soft non tender.   Ext: No edema  MS: Adequate ROM spine, shoulders, hips and knees.  Skin: Intact, no ulcerations or rash noted.  Psych: Good eye contact, normal affect. Memory intact not anxious or depressed appearing.  CNS: CN 2-12 intact, power,  normal throughout.no focal deficits noted.   Assessment & Plan  Essential hypertension Controlled, no change in medication DASH diet and commitment to daily physical activity for a minimum of 30 minutes  discussed and encouraged, as a part of hypertension management. The importance of attaining a healthy weight is also discussed.  BP/Weight 10/15/2021 08/12/2021 04/07/2021 03/31/2021 03/30/2021 03/26/2021 62/26/3335  Systolic BP 456 256 389 373 428 768 115  Diastolic BP 82 79 84 83 78 82 84  Wt. (Lbs) 184.04 183.04 181.12 178 176.12 177 187  BMI 32.6 32.42 32.08 31.53 31.2 31.35 33.13       Obesity (BMI 30.0-34.9)  Patient re-educated about  the importance of commitment to a  minimum of 150 minutes of exercise per week as able.  The importance of healthy food choices with portion control discussed, as well as eating regularly and within a 12 hour window most days. The need to choose "clean , green" food 50 to 75% of the time is discussed, as well as to make water the primary drink and set a goal of 64 ounces water daily.    Weight /BMI 10/15/2021 08/12/2021 04/07/2021  WEIGHT 184 lb 0.6 oz 183 lb 0.6 oz 181 lb 1.9 oz  HEIGHT 5\' 3"  5\' 3"  5\' 3"   BMI 32.6 kg/m2 32.42 kg/m2 32.08 kg/m2

## 2022-02-06 LAB — CMP14+EGFR
ALT: 14 IU/L (ref 0–32)
AST: 24 IU/L (ref 0–40)
Albumin/Globulin Ratio: 1.7 (ref 1.2–2.2)
Albumin: 4.6 g/dL (ref 3.8–4.9)
Alkaline Phosphatase: 78 IU/L (ref 44–121)
BUN/Creatinine Ratio: 24 — ABNORMAL HIGH (ref 9–23)
BUN: 18 mg/dL (ref 6–24)
Bilirubin Total: 0.3 mg/dL (ref 0.0–1.2)
CO2: 27 mmol/L (ref 20–29)
Calcium: 9.3 mg/dL (ref 8.7–10.2)
Chloride: 102 mmol/L (ref 96–106)
Creatinine, Ser: 0.76 mg/dL (ref 0.57–1.00)
Globulin, Total: 2.7 g/dL (ref 1.5–4.5)
Glucose: 97 mg/dL (ref 70–99)
Potassium: 4 mmol/L (ref 3.5–5.2)
Sodium: 141 mmol/L (ref 134–144)
Total Protein: 7.3 g/dL (ref 6.0–8.5)
eGFR: 92 mL/min/{1.73_m2} (ref >59)

## 2022-02-06 LAB — HEMOGLOBIN A1C
Est. average glucose Bld gHb Est-mCnc: 126 mg/dL
Hgb A1c MFr Bld: 6 % — ABNORMAL HIGH (ref 4.8–5.6)

## 2022-02-06 LAB — LIPID PANEL
Chol/HDL Ratio: 2.9 ratio (ref 0.0–4.4)
Cholesterol, Total: 196 mg/dL (ref 100–199)
HDL: 68 mg/dL (ref >39)
LDL Chol Calc (NIH): 118 mg/dL — ABNORMAL HIGH (ref 0–99)
Triglycerides: 53 mg/dL (ref 0–149)
VLDL Cholesterol Cal: 10 mg/dL (ref 5–40)

## 2022-02-06 LAB — VITAMIN D 25 HYDROXY (VIT D DEFICIENCY, FRACTURES): Vit D, 25-Hydroxy: 36.9 ng/mL (ref 30.0–100.0)

## 2022-04-14 ENCOUNTER — Encounter: Payer: Self-pay | Admitting: Family Medicine

## 2022-04-14 ENCOUNTER — Ambulatory Visit: Payer: BC Managed Care – PPO | Admitting: Family Medicine

## 2022-04-14 VITALS — BP 134/82 | HR 80 | Ht 63.0 in | Wt 190.1 lb

## 2022-04-14 DIAGNOSIS — Z13220 Encounter for screening for lipoid disorders: Secondary | ICD-10-CM

## 2022-04-14 DIAGNOSIS — I10 Essential (primary) hypertension: Secondary | ICD-10-CM

## 2022-04-14 DIAGNOSIS — Z1231 Encounter for screening mammogram for malignant neoplasm of breast: Secondary | ICD-10-CM | POA: Diagnosis not present

## 2022-04-14 DIAGNOSIS — E669 Obesity, unspecified: Secondary | ICD-10-CM | POA: Diagnosis not present

## 2022-04-14 DIAGNOSIS — R7302 Impaired glucose tolerance (oral): Secondary | ICD-10-CM

## 2022-04-14 DIAGNOSIS — N3941 Urge incontinence: Secondary | ICD-10-CM | POA: Diagnosis not present

## 2022-04-14 MED ORDER — OXYBUTYNIN CHLORIDE ER 5 MG PO TB24: 5.0000 mg | ORAL_TABLET | Freq: Every day | ORAL | 4 refills | Status: DC

## 2022-04-14 MED ORDER — TRIAMTERENE-HCTZ 75-50 MG PO TABS: 1.0000 | ORAL_TABLET | Freq: Every day | ORAL | 1 refills | Status: DC

## 2022-04-14 NOTE — Progress Notes (Signed)
? ?Kari Mason     MRN: [id number]      DOB: [birth year] ? ? ?HPI ?Kari Mason is here for follow up and re-evaluation of chronic medical conditions, medication management and review of any available recent lab and radiology data.  ?Preventive health is updated, specifically  Cancer screening and Immunization.   ?Questions or concerns regarding consultations or procedures which the PT has had in the interim are  addressed. ?The PT denies any adverse reactions to current medications since the last visit.  ?There are no new concerns.  ?There are no specific complaints  ? ?ROS ?Denies recent fever or chills. ?Denies sinus pressure, nasal congestion, ear pain or sore throat. ?Denies chest congestion, productive cough or wheezing. ?Denies chest pains, palpitations and leg swelling ?Denies abdominal pain, nausea, vomiting,diarrhea or constipation.   ?Denies dysuria, frequency, hesitancy c/o incontinence. ?Denies joint pain, swelling and limitation in mobility. ?Denies headaches, seizures, numbness, or tingling. ?Denies depression, anxiety or insomnia.c/o increase stress on the job ?Denies skin break down or rash. ? ? ?PE ? ?BP 134/82   Pulse 80   Ht '5\' 3"'$  (1.6 m)   Wt 190 lb 1.9 oz (86.2 kg)   LMP 11/16/2018 (Approximate)   SpO2 96%   BMI 33.68 kg/m?  ? ?Patient alert and oriented and in no cardiopulmonary distress. ? ?HEENT: No facial asymmetry, EOMI,     Neck supple . ? ?Chest: Clear to auscultation bilaterally. ? ?CVS: S1, S2 no murmurs, no S3.Regular rate. ? ?ABD: Soft non tender.  ? ?Ext: No edema ? ?MS: Adequate ROM spine, shoulders, hips and knees. ? ?Skin: Intact, no ulcerations or rash noted. ? ?Psych: Good eye contact, normal affect. Memory intact not anxious or depressed appearing. ? ?CNS: CN 2-12 intact, power,  normal throughout.no focal deficits noted. ? ? ?Assessment & Plan ? ?Essential hypertension ?Controlled, no change in medication ?DASH diet and commitment to daily physical activity for a minimum  of 30 minutes discussed and encouraged, as a part of hypertension management. ?The importance of attaining a healthy weight is also discussed. ? ? ?  04/14/2022  ?  8:52 AM 04/14/2022  ?  8:40 AM 10/15/2021  ?  8:46 AM 10/15/2021  ?  8:34 AM 08/12/2021  ? 10:32 AM 04/07/2021  ?  8:38 AM 04/07/2021  ?  8:31 AM  ?BP/Weight  ?Systolic BP 235 573 220 254 141 138 146  ?Diastolic BP 82 81 82 78 79 84 74  ?Wt. (Lbs)  190.12  184.04 183.04  181.12  ?BMI  33.68 kg/m2  32.6 kg/m2 32.42 kg/m2  32.08 kg/m2  ? ? ? ? ? ?Obesity (BMI 30.0-34.9) ? ?Patient re-educated about  the importance of commitment to a  minimum of 150 minutes of exercise per week as able. ? ?The importance of healthy food choices with portion control discussed, as well as eating regularly and within a 12 hour window most days. ?The need to choose "clean , green" food 50 to 75% of the time is discussed, as well as to make water the primary drink and set a goal of 64 ounces water daily. ? ?  ? ?  04/14/2022  ?  8:40 AM 10/15/2021  ?  8:34 AM 08/12/2021  ? 10:32 AM  ?Weight /BMI  ?Weight 190 lb 1.9 oz 184 lb 0.6 oz 183 lb 0.6 oz  ?Height '5\' 3"'$  (1.6 m) '5\' 3"'$  (1.6 m) '5\' 3"'$  (1.6 m)  ?BMI 33.68 kg/m2 32.6 kg/m2 32.42 kg/m2  ? ? ? ? ?  Urinary incontinence ?kegel exercises and start ditropan as needed, has limited  bathroom break on the job which is problematic for her ? ? ?

## 2022-04-14 NOTE — Patient Instructions (Addendum)
Annual exam mid to end September, call if you need me sooner ? ?Please schedule mammogram at checkout ? ?New medication to help with incontinence is ditropan, use as needed ? ?Please get fasting CBC, lipid, cmp and EGFr, TSH and HBA1C 1 week before your September appointment ? ?Hoping that your current stress will  soon be reduced ? ?It is important that you exercise regularly at least 30 minutes 5 times a week. If you develop chest pain, have severe difficulty breathing, or feel very tired, stop exercising immediately and seek medical attention  ? ?Thanks for choosing [facility], we consider it a privelige to serve you. ? ?

## 2022-04-18 ENCOUNTER — Encounter: Payer: Self-pay | Admitting: Family Medicine

## 2022-04-18 DIAGNOSIS — R32 Unspecified urinary incontinence: Secondary | ICD-10-CM | POA: Insufficient documentation

## 2022-04-18 NOTE — Assessment & Plan Note (Signed)
Controlled, no change in medication ?DASH diet and commitment to daily physical activity for a minimum of 30 minutes discussed and encouraged, as a part of hypertension management. ?The importance of attaining a healthy weight is also discussed. ? ? ?  04/14/2022  ?  8:52 AM 04/14/2022  ?  8:40 AM 10/15/2021  ?  8:46 AM 10/15/2021  ?  8:34 AM 08/12/2021  ? 10:32 AM 04/07/2021  ?  8:38 AM 04/07/2021  ?  8:31 AM  ?BP/Weight  ?Systolic BP 016 553 748 270 141 138 146  ?Diastolic BP 82 81 82 78 79 84 74  ?Wt. (Lbs)  190.12  184.04 183.04  181.12  ?BMI  33.68 kg/m2  32.6 kg/m2 32.42 kg/m2  32.08 kg/m2  ? ? ? ? ?

## 2022-04-18 NOTE — Assessment & Plan Note (Addendum)
kegel exercises and start ditropan as needed, has limited  bathroom break on the job which is problematic for her ?

## 2022-04-18 NOTE — Assessment & Plan Note (Signed)
?  Patient re-educated about  the importance of commitment to a  minimum of 150 minutes of exercise per week as able. ? ?The importance of healthy food choices with portion control discussed, as well as eating regularly and within a 12 hour window most days. ?The need to choose "clean , green" food 50 to 75% of the time is discussed, as well as to make water the primary drink and set a goal of 64 ounces water daily. ? ?  ? ?  04/14/2022  ?  8:40 AM 10/15/2021  ?  8:34 AM 08/12/2021  ? 10:32 AM  ?Weight /BMI  ?Weight 190 lb 1.9 oz 184 lb 0.6 oz 183 lb 0.6 oz  ?Height '5\' 3"'$  (1.6 m) '5\' 3"'$  (1.6 m) '5\' 3"'$  (1.6 m)  ?BMI 33.68 kg/m2 32.6 kg/m2 32.42 kg/m2  ? ? ? ?

## 2022-07-30 ENCOUNTER — Ambulatory Visit
Admission: RE | Admit: 2022-07-30 | Discharge: 2022-07-30 | Disposition: A | Discharge status: Home / Self Care | Payer: BC Managed Care – PPO | Source: Ambulatory Visit | Attending: Family Medicine | Admitting: Family Medicine

## 2022-08-07 LAB — CMP14+EGFR
ALT: 17 IU/L (ref 0–32)
AST: 18 IU/L (ref 0–40)
Albumin/Globulin Ratio: 1.3 (ref 1.2–2.2)
Albumin: 4.2 g/dL (ref 3.8–4.9)
Alkaline Phosphatase: 82 IU/L (ref 44–121)
BUN/Creatinine Ratio: 23 (ref 9–23)
BUN: 17 mg/dL (ref 6–24)
Bilirubin Total: 0.3 mg/dL (ref 0.0–1.2)
CO2: 24 mmol/L (ref 20–29)
Calcium: 9.5 mg/dL (ref 8.7–10.2)
Chloride: 100 mmol/L (ref 96–106)
Creatinine, Ser: 0.73 mg/dL (ref 0.57–1.00)
Globulin, Total: 3.2 g/dL (ref 1.5–4.5)
Glucose: 100 mg/dL — ABNORMAL HIGH (ref 70–99)
Potassium: 4.4 mmol/L (ref 3.5–5.2)
Sodium: 140 mmol/L (ref 134–144)
Total Protein: 7.4 g/dL (ref 6.0–8.5)
eGFR: 96 mL/min/{1.73_m2} (ref >59)

## 2022-08-07 LAB — CBC
Hematocrit: 37.3 % (ref 34.0–46.6)
Hemoglobin: 11.5 g/dL (ref 11.1–15.9)
MCH: 22.2 pg — ABNORMAL LOW (ref 26.6–33.0)
MCHC: 30.8 g/dL — ABNORMAL LOW (ref 31.5–35.7)
MCV: 72 fL — ABNORMAL LOW (ref 79–97)
Platelets: 329 10*3/uL (ref 150–450)
RBC: 5.19 x10E6/uL (ref 3.77–5.28)
RDW: 14.5 % (ref 11.7–15.4)
WBC: 6 10*3/uL (ref 3.4–10.8)

## 2022-08-07 LAB — TSH: TSH: 1.8 u[IU]/mL (ref 0.450–4.500)

## 2022-08-07 LAB — LIPID PANEL
Chol/HDL Ratio: 3.1 ratio (ref 0.0–4.4)
Cholesterol, Total: 198 mg/dL (ref 100–199)
HDL: 64 mg/dL (ref >39)
LDL Chol Calc (NIH): 111 mg/dL — ABNORMAL HIGH (ref 0–99)
Triglycerides: 131 mg/dL (ref 0–149)
VLDL Cholesterol Cal: 23 mg/dL (ref 5–40)

## 2022-08-07 LAB — HEMOGLOBIN A1C
Est. average glucose Bld gHb Est-mCnc: 126 mg/dL
Hgb A1c MFr Bld: 6 % — ABNORMAL HIGH (ref 4.8–5.6)

## 2022-08-13 ENCOUNTER — Encounter: Payer: BC Managed Care – PPO | Admitting: Family Medicine

## 2022-11-19 ENCOUNTER — Ambulatory Visit (INDEPENDENT_AMBULATORY_CARE_PROVIDER_SITE_OTHER): Payer: BC Managed Care – PPO | Admitting: Family Medicine

## 2022-11-19 ENCOUNTER — Encounter: Payer: Self-pay | Admitting: Family Medicine

## 2022-11-19 VITALS — BP 160/100 | HR 91 | Resp 16 | Ht 63.0 in | Wt 199.0 lb

## 2022-11-19 DIAGNOSIS — Z0001 Encounter for general adult medical examination with abnormal findings: Secondary | ICD-10-CM | POA: Diagnosis not present

## 2022-11-19 DIAGNOSIS — R7302 Impaired glucose tolerance (oral): Secondary | ICD-10-CM | POA: Diagnosis not present

## 2022-11-19 DIAGNOSIS — I10 Essential (primary) hypertension: Secondary | ICD-10-CM | POA: Diagnosis not present

## 2022-11-19 DIAGNOSIS — T733XXA Exhaustion due to excessive exertion, initial encounter: Secondary | ICD-10-CM

## 2022-11-19 MED ORDER — AMLODIPINE BESYLATE 5 MG PO TABS: 5.0000 mg | ORAL_TABLET | Freq: Every day | ORAL | 2 refills | Status: DC

## 2022-11-19 MED ORDER — TRIAMTERENE-HCTZ 75-50 MG PO TABS: 1.0000 | ORAL_TABLET | Freq: Every day | ORAL | 3 refills | Status: DC

## 2022-11-19 MED ORDER — LORATADINE 10 MG PO TABS: 10.0000 mg | ORAL_TABLET | Freq: Every day | ORAL | 11 refills | Status: DC

## 2022-11-19 NOTE — Patient Instructions (Signed)
F/U in 4 weeks, re evaluate blood pressure , call if you need mne sooner  Additional medication for BP is amlodipine 5 mg  one daily, conti ue triamterene one daily  PLEASE stop the ice cream and start more vegetables and fruit  Blood pressure is high  Please get non fasting chem 7  and EGFR and hBA1C 3 days before next visit   You are referred to Cardiology for heart evaluation  It is important that you exercise regularly at least 30 minutes 5 times a week. If you develop chest pain, have severe difficulty breathing, or feel very tired, stop exercising immediately and seek medical attention

## 2022-11-21 ENCOUNTER — Encounter: Payer: Self-pay | Admitting: Family Medicine

## 2022-11-21 DIAGNOSIS — T733XXA Exhaustion due to excessive exertion, initial encounter: Secondary | ICD-10-CM | POA: Insufficient documentation

## 2022-11-21 DIAGNOSIS — Z0001 Encounter for general adult medical examination with abnormal findings: Secondary | ICD-10-CM | POA: Insufficient documentation

## 2022-11-21 NOTE — Progress Notes (Signed)
    Kari Mason     MRN: [id number]      DOB: [birth year]  HPI: Patient is in for annual physical exam. C/o shortness of breath and poor exercise tolerance since 10/20/2022 Immunization is reviewed , and  updated if needed.   PE: BP (!) 160/100   Pulse 91   Resp 16   Ht '5\' 3"'$  (1.6 m)   Wt 199 lb (90.3 kg)   LMP 11/16/2018 (Approximate)   SpO2 97%   BMI 35.25 kg/m   Pleasant  female, alert and oriented x 3, in no cardio-pulmonary distress. Afebrile. HEENT No facial trauma or asymetry. Sinuses non tender.  Extra occullar muscles intact.. External ears normal, . Neck: supple, no adenopathy,JVD or thyromegaly.No bruits.  Chest: Clear to ascultation bilaterally.No crackles or wheezes. Non tender to palpation  Cardiovascular system; Heart sounds normal,  S1 and  S2 ,no S3.  No murmur, or thrill. Apical beat not displaced Peripheral pulses normal.  Abdomen: Soft, non tender, no organomegaly or masses. No bruits. Bowel sounds normal. No guarding, tenderness or rebound.    Musculoskeletal exam: Full ROM of spine, hips , shoulders and knees. No deformity ,swelling or crepitus noted. No muscle wasting or atrophy.   Neurologic: Cranial nerves 2 to 12 intact. Power, tone ,sensation and reflexes normal throughout. No disturbance in gait. No tremor.  Skin: Intact, no ulceration, erythema , scaling or rash noted. Pigmentation normal throughout  Psych; Normal mood and affect. Judgement and concentration normal   Assessment & Plan:  Annual visit for general adult medical examination with abnormal findings Annual exam as documented. Counseling done  re healthy lifestyle involving commitment to 150 minutes exercise per week, heart healthy diet, and attaining healthy weight.The importance of adequate sleep also discussed. Regular seat belt use and home safety, is also discussed. Changes in health habits are decided on by the patient with goals and time frames  set for  achieving them. Immunization and cancer screening needs are specifically addressed at this visit.   Fatigue due to excessive exertion 1 month h/o shortness of breath and poor exercise tolerance, has multiple cardiac risk factrs, refer Cardiology for evaluation  Essential hypertension Uncontrolled, add amlodipine 5 mg DASH diet and commitment to daily physical activity for a minimum of 30 minutes discussed and encouraged, as a part of hypertension management. The importance of attaining a healthy weight is also discussed.     11/19/2022    4:50 PM 11/19/2022    4:00 PM 04/14/2022    8:52 AM 04/14/2022    8:40 AM 10/15/2021    8:46 AM 10/15/2021    8:34 AM 08/12/2021   10:32 AM  BP/Weight  Systolic BP 185 631 497 026 378 588 502  Diastolic BP 774 128 82 81 82 78 79  Wt. (Lbs)  199  190.12  184.04 183.04  BMI  35.25 kg/m2  33.68 kg/m2  32.6 kg/m2 32.42 kg/m2

## 2022-11-21 NOTE — Assessment & Plan Note (Signed)

## 2022-11-21 NOTE — Assessment & Plan Note (Signed)
Uncontrolled, add amlodipine 5 mg DASH diet and commitment to daily physical activity for a minimum of 30 minutes discussed and encouraged, as a part of hypertension management. The importance of attaining a healthy weight is also discussed.     11/19/2022    4:50 PM 11/19/2022    4:00 PM 04/14/2022    8:52 AM 04/14/2022    8:40 AM 10/15/2021    8:46 AM 10/15/2021    8:34 AM 08/12/2021   10:32 AM  BP/Weight  Systolic BP 929 090 301 499 692 493 241  Diastolic BP 991 444 82 81 82 78 79  Wt. (Lbs)  199  190.12  184.04 183.04  BMI  35.25 kg/m2  33.68 kg/m2  32.6 kg/m2 32.42 kg/m2

## 2022-11-21 NOTE — Assessment & Plan Note (Signed)
1 month h/o shortness of breath and poor exercise tolerance, has multiple cardiac risk factrs, refer Cardiology for evaluation

## 2022-12-03 ENCOUNTER — Ambulatory Visit: Payer: BC Managed Care – PPO | Attending: Internal Medicine | Admitting: Internal Medicine

## 2022-12-03 ENCOUNTER — Encounter: Payer: Self-pay | Admitting: Internal Medicine

## 2022-12-03 VITALS — BP 142/94 | HR 94 | Ht 63.0 in | Wt 192.0 lb

## 2022-12-03 DIAGNOSIS — I10 Essential (primary) hypertension: Secondary | ICD-10-CM

## 2022-12-03 NOTE — Progress Notes (Signed)
Cardiology Office Note  Date: 12/03/2022   ID: Kari Mason, DOB May 01, 1966, MRN [id number]  PCP:  [name], MD  Cardiologist:  None Electrophysiologist:  None   Reason for Office Visit: HTN management and heart failure symptoms at the request of Dr. Moshe Cipro   History of Present Illness: Kari Mason is a 56 y.o. female known to have HTN, GERD was referred to cardiology clinic for management of HTN and heart failure symptoms.  Patient denied any angina, DOE, dizziness/lightness, syncope, palpitations, LE swelling.  She can climb 2 flights of stairs and walk 1 mile at her own pace with no symptoms of shortness of breath. No prior ischemic evaluation. No prior history of MI/PCI/CABG.  Denied smoking cigarettes, alcohol use and illicit drug abuse. No family history of premature ASCVD.  She is thinking to lose weight but likes to eat ice cream 3 times per week.  Patient does not check her blood pressure at home.  No blood pressure cuff at home.  Past Medical History:  Diagnosis Date   Allergy    GERD (gastroesophageal reflux disease)    Hypertension     Past Surgical History:  Procedure Laterality Date   COLONOSCOPY N/A 10/15/2015   Procedure: COLONOSCOPY;  Surgeon: Rogene Houston, MD;  Location: [facility];  Service: Endoscopy;  Laterality: N/A;  1200   COLONOSCOPY N/A 12/07/2018   Procedure: COLONOSCOPY;  Surgeon: Rogene Houston, MD;  Location: [facility];  Service: Endoscopy;  Laterality: N/A;  1:45   TUBAL LIGATION      Current Outpatient Medications  Medication Sig Dispense Refill   amLODipine (NORVASC) 5 MG tablet Take 1 tablet (5 mg total) by mouth daily. 30 tablet 2   loratadine (CLARITIN) 10 MG tablet Take 1 tablet (10 mg total) by mouth daily. 30 tablet 11   Multiple Vitamin (MULTIVITAMIN WITH MINERALS) TABS tablet Take 1 tablet by mouth daily.     oxybutynin (DITROPAN XL) 5 MG 24 hr tablet Take 1 tablet (5 mg total) by mouth at bedtime. 30 tablet  4   triamterene-hydrochlorothiazide (MAXZIDE) 75-50 MG tablet Take 1 tablet by mouth daily. 90 tablet 3   No current facility-administered medications for this visit.   Allergies:  Lisinopril   Social History: The patient  reports that she has never smoked. She has never used smokeless tobacco. She reports that she does not drink alcohol and does not use drugs.   Family History: The patient's family history includes Diabetes in her sister; Hypertension in her father and sister.   ROS:  Please see the history of present illness. Otherwise, complete review of systems is positive for none.  All other systems are reviewed and negative.   Physical Exam: VS:  BP (!) 142/94   Pulse 94   Ht _0  (1.6 m)   Wt 192 lb (87.1 kg)   LMP 11/16/2018 (Approximate)   SpO2 95%   BMI 34.01 kg/m , BMI Body mass index is 34.01 kg/m.  Wt Readings from Last 3 Encounters:  12/03/22 192 lb (87.1 kg)  11/19/22 199 lb (90.3 kg)  04/14/22 190 lb 1.9 oz (86.2 kg)    General: Patient appears comfortable at rest. HEENT: Conjunctiva and lids normal, oropharynx clear with moist mucosa. Neck: Supple, no elevated JVP or carotid bruits, no thyromegaly. Lungs: Clear to auscultation, nonlabored breathing at rest. Cardiac: Regular rate and rhythm, no S3 or significant systolic murmur, no pericardial rub. Abdomen: Soft, nontender, no hepatomegaly, bowel  sounds present, no guarding or rebound. Extremities: No pitting edema, distal pulses 2+. Skin: Warm and dry. Musculoskeletal: No kyphosis. Neuropsychiatric: Alert and oriented x3, affect grossly appropriate.  ECG:  An ECG dated 11/25/2022 was personally reviewed today and demonstrated:  Normal sinus rhythm and no ST-T changes  Recent Labwork: 08/06/2022: ALT 17; AST 18; BUN 17; Creatinine, Ser 0.73; Hemoglobin 11.5; Platelets 329; Potassium 4.4; Sodium 140; TSH 1.800     Component Value Date/Time   CHOL 198 08/06/2022 0831   TRIG 131 08/06/2022 0831   HDL 64  08/06/2022 0831   CHOLHDL 3.1 08/06/2022 0831   CHOLHDL 3.2 06/11/2019 1012   VLDL 14 12/27/2016 0830   LDLCALC 111 (H) 08/06/2022 0831   LDLCALC 123 (H) 06/11/2019 1012    Other Studies Reviewed Today:   Assessment and Plan: Patient is a 56 year old F known to have HTN, GERD was referred to cardiology clinic for management of HTN and HF symptoms.  # HTN, rule out whitecoat HTN -Patient did not take blood pressure medications this morning.  Continue current antihypertensives, amlodipine 5 mg once daily and triamterene-HCTZ 75-50 mg once daily. -Instructed patient to purchase BP kit, check her blood pressure at home daily in a.m. (before taking medications ) and p.m. at bedtime. Keep a log of blood pressure readings and bring it in the next visit. Obtain 2D echocardiogram. -Counseled on 2 g sodium diet, weight loss (educated on intermittent fasting which has shown to reduce weight significantly). -No need of OSA screening as she does not have any OSA symptoms.  # HF symptoms -Patient can climb 2 flights of stairs and walk 1 mile at her own pace with no symptoms of SOB. Compensated on physical examination. I do not think she has HF symptoms.   I have spent a total of 31 minutes with patient reviewing chart , EKGs, labs and examining patient as well as establishing an assessment and plan that was discussed with the patient.  > 50% of time was spent in direct patient care.     Medication Adjustments/Labs and Tests Ordered: Current medicines are reviewed at length with the patient today.  Concerns regarding medicines are outlined above.   Tests Ordered: Orders Placed This Encounter  Procedures   EKG 12-Lead   ECHOCARDIOGRAM COMPLETE    Medication Changes: No orders of the defined types were placed in this encounter.   Disposition:  Follow up  6 months  Signed, Lucky Alverson Fidel Levy, MD, 12/03/2022 8:41 AM    [facility] [place]. [address] [place], [place] [address]

## 2022-12-03 NOTE — Patient Instructions (Signed)
Medication Instructions:  Your physician recommends that you continue on your current medications as directed. Please refer to the Current Medication list given to you today.   *If you need a refill on your cardiac medications before your next appointment, please call your pharmacy*   Lab Work: NONE ordered at this time of appointment   If you have labs (blood work) drawn today and your tests are completely normal, you will receive your results only by: [place] (if you have MyChart) OR A paper copy in the mail If you have any lab test that is abnormal or we need to change your treatment, we will call you to review the results.   Testing/Procedures: Your physician has requested that you have an echocardiogram. Echocardiography is a painless test that uses sound waves to create images of your heart. It provides your doctor with information about the size and shape of your heart and how well your heart's chambers and valves are working. This procedure takes approximately one hour. There are no restrictions for this procedure. Please do NOT wear cologne, perfume, aftershave, or lotions (deodorant is allowed). Please arrive 15 minutes prior to your appointment time.    Follow-Up: At [facility], you and your health needs are our priority.  As part of our continuing mission to provide you with exceptional heart care, we have created designated Provider Care Teams.  These Care Teams include your primary Cardiologist (physician) and Advanced Practice Providers (APPs -  Physician Assistants and Nurse Practitioners) who all work together to provide you with the care you need, when you need it.  We recommend signing up for the patient portal called "MyChart".  Sign up information is provided on this After Visit Summary.  MyChart is used to connect with patients for Virtual Visits (Telemedicine).  Patients are able to view lab/test results, encounter notes, upcoming appointments, etc.   Non-urgent messages can be sent to your provider as well.   To learn more about what you can do with MyChart, go to [email or web address].    Your next appointment:   6 month(s)  The format for your next appointment:   In Person  Provider:   Claudina Lick, MD    Other Instructions   Important Information About Sugar

## 2022-12-17 ENCOUNTER — Encounter: Payer: Self-pay | Admitting: Family Medicine

## 2022-12-17 ENCOUNTER — Ambulatory Visit: Payer: BC Managed Care – PPO | Admitting: Family Medicine

## 2022-12-17 VITALS — BP 124/82 | HR 84 | Ht 63.0 in | Wt 189.0 lb

## 2022-12-17 DIAGNOSIS — I10 Essential (primary) hypertension: Secondary | ICD-10-CM

## 2022-12-17 DIAGNOSIS — R7302 Impaired glucose tolerance (oral): Secondary | ICD-10-CM | POA: Diagnosis not present

## 2022-12-17 DIAGNOSIS — E669 Obesity, unspecified: Secondary | ICD-10-CM

## 2022-12-17 NOTE — Patient Instructions (Signed)
F/U in 6 months, call if you nee me sooner  Excellent blood pressure,  It is important that you exercise regularly at least 30 minutes 5 times a week. If you develop chest pain, have severe difficulty breathing, or feel very tired, stop exercising immediately and seek medical attention  Think about what you will eat, plan ahead. Choose " clean, green, fresh or frozen" over canned, processed or packaged foods which are more sugary, salty and fatty. 70 to 75% of food eaten should be vegetables and fruit. Three meals at set times with snacks allowed between meals, but they must be fruit or vegetables. Aim to eat over a 12 hour period , example 7 am to 7 pm, and STOP after  your last meal of the day. Drink water,generally about 64 ounces per day, no other drink is as healthy. Fruit juice is best enjoyed in a healthy way, by EATING the fruit. Thanks for choosing [facility], we consider it a privelige to serve you.

## 2022-12-18 LAB — BMP8+EGFR
BUN/Creatinine Ratio: 28 — ABNORMAL HIGH (ref 9–23)
BUN: 20 mg/dL (ref 6–24)
CO2: 24 mmol/L (ref 20–29)
Calcium: 10.1 mg/dL (ref 8.7–10.2)
Chloride: 102 mmol/L (ref 96–106)
Creatinine, Ser: 0.71 mg/dL (ref 0.57–1.00)
Glucose: 104 mg/dL — ABNORMAL HIGH (ref 70–99)
Potassium: 4.4 mmol/L (ref 3.5–5.2)
Sodium: 141 mmol/L (ref 134–144)
eGFR: 99 mL/min/{1.73_m2} (ref >59)

## 2022-12-18 LAB — HEMOGLOBIN A1C
Est. average glucose Bld gHb Est-mCnc: 128 mg/dL
Hgb A1c MFr Bld: 6.1 % — ABNORMAL HIGH (ref 4.8–5.6)

## 2022-12-19 ENCOUNTER — Encounter: Payer: Self-pay | Admitting: Family Medicine

## 2022-12-19 MED ORDER — AMLODIPINE BESYLATE 5 MG PO TABS: 5.0000 mg | ORAL_TABLET | Freq: Every day | ORAL | 3 refills | Status: DC

## 2022-12-19 NOTE — Assessment & Plan Note (Signed)
Deteriorated Patient educated about the importance of limiting  Carbohydrate intake , the need to commit to daily physical activity for a minimum of 30 minutes , and to commit weight loss. The fact that changes in all these areas will reduce or eliminate all together the development of diabetes is stressed.      Latest Ref Rng & Units 12/17/2022    8:43 AM 08/06/2022    8:31 AM 02/05/2022    8:38 AM 11/24/2020    8:50 AM 06/11/2019   10:12 AM  Diabetic Labs  HbA1c 4.8 - 5.6 % 6.1  6.0  6.0  5.5  5.8   Chol 100 - 199 mg/dL  198  196  193  205   HDL >39 mg/dL  64  68  63  65   Calc LDL 0 - 99 mg/dL  111  118  115  123   Triglycerides 0 - 149 mg/dL  131  53  83  73   Creatinine 0.57 - 1.00 mg/dL 0.71  0.73  0.76  0.61  0.57       12/17/2022    4:44 PM 12/17/2022    4:43 PM 12/17/2022    4:26 PM 12/17/2022    4:25 PM 12/03/2022    7:50 AM 11/19/2022    4:50 PM 11/19/2022    4:00 PM  BP/Weight  Systolic BP 893 734 287 681 157 262 035  Diastolic BP 82 72 74 68 94 100 102  Wt. (Lbs)    189 192  199  BMI    33.48 kg/m2 34.01 kg/m2  35.25 kg/m2       No data to display

## 2022-12-19 NOTE — Assessment & Plan Note (Signed)
  Patient re-educated about  the importance of commitment to a  minimum of 150 minutes of exercise per week as able.  The importance of healthy food choices with portion control discussed, as well as eating regularly and within a 12 hour window most days. The need to choose "clean , green" food 50 to 75% of the time is discussed, as well as to make water the primary drink and set a goal of 64 ounces water daily.       12/17/2022    4:25 PM 12/03/2022    7:50 AM 11/19/2022    4:00 PM  Weight /BMI  Weight 189 lb 192 lb 199 lb  Height '5\' 3"'$  (1.6 m) '5\' 3"'$  (1.6 m) '5\' 3"'$  (1.6 m)  BMI 33.48 kg/m2 34.01 kg/m2 35.25 kg/m2    Slight improvement

## 2022-12-19 NOTE — Progress Notes (Signed)
Kari Mason     MRN: [id number]      DOB: Apr 27, 1966   HPI Kari Mason is here for follow up and re-evaluation of chronic medical conditions, medication management and review of any available recent lab and radiology data.  Preventive health is updated, specifically  Cancer screening and Immunization.   Questions or concerns regarding consultations or procedures which the PT has had in the interim are  addressed. The PT denies any adverse reactions to current medications since the last visit.  There are no new concerns.  There are no specific complaints   ROS Denies recent fever or chills. Denies sinus pressure, nasal congestion, ear pain or sore throat. Denies chest congestion, productive cough or wheezing. Denies chest pains, palpitations and leg swelling Denies abdominal pain, nausea, vomiting,diarrhea or constipation.   Denies dysuria, frequency, hesitancy or incontinence. Denies joint pain, swelling and limitation in mobility. Denies headaches, seizures, numbness, or tingling. Denies depression, anxiety or insomnia. Denies skin break down or rash.   PE  BP 124/82   Pulse 84   Ht '5\' 3"'$  (1.6 m)   Wt 189 lb (85.7 kg)   LMP 11/16/2018 (Approximate)   SpO2 98%   BMI 33.48 kg/m   Patient alert and oriented and in no cardiopulmonary distress.  HEENT: No facial asymmetry, EOMI,     Neck supple .  Chest: Clear to auscultation bilaterally.  CVS: S1, S2 no murmurs, no S3.Regular rate.  ABD: Soft non tender.   Ext: No edema  MS: Adequate ROM spine, shoulders, hips and knees.  Skin: Intact, no ulcerations or rash noted.  Psych: Good eye contact, normal affect. Memory intact not anxious or depressed appearing.  CNS: CN 2-12 intact, power,  normal throughout.no focal deficits noted.   Assessment & Plan  Essential hypertension Controlled, no change in medication DASH diet and commitment to daily physical activity for a minimum of 30 minutes discussed and encouraged,  as a part of hypertension management. The importance of attaining a healthy weight is also discussed.     12/17/2022    4:44 PM 12/17/2022    4:43 PM 12/17/2022    4:26 PM 12/17/2022    4:25 PM 12/03/2022    7:50 AM 11/19/2022    4:50 PM 11/19/2022    4:00 PM  BP/Weight  Systolic BP 710 626 948 546 270 350 093  Diastolic BP 82 72 74 68 94 100 102  Wt. (Lbs)    189 192  199  BMI    33.48 kg/m2 34.01 kg/m2  35.25 kg/m2       IGT (impaired glucose tolerance) Deteriorated Patient educated about the importance of limiting  Carbohydrate intake , the need to commit to daily physical activity for a minimum of 30 minutes , and to commit weight loss. The fact that changes in all these areas will reduce or eliminate all together the development of diabetes is stressed.      Latest Ref Rng & Units 12/17/2022    8:43 AM 08/06/2022    8:31 AM 02/05/2022    8:38 AM 11/24/2020    8:50 AM 06/11/2019   10:12 AM  Diabetic Labs  HbA1c 4.8 - 5.6 % 6.1  6.0  6.0  5.5  5.8   Chol 100 - 199 mg/dL  198  196  193  205   HDL >39 mg/dL  64  68  63  65   Calc LDL 0 - 99 mg/dL  111  118  115  123   Triglycerides 0 - 149 mg/dL  131  53  83  73   Creatinine 0.57 - 1.00 mg/dL 0.71  0.73  0.76  0.61  0.57       12/17/2022    4:44 PM 12/17/2022    4:43 PM 12/17/2022    4:26 PM 12/17/2022    4:25 PM 12/03/2022    7:50 AM 11/19/2022    4:50 PM 11/19/2022    4:00 PM  BP/Weight  Systolic BP 625 638 937 342 876 811 572  Diastolic BP 82 72 74 68 94 100 102  Wt. (Lbs)    189 192  199  BMI    33.48 kg/m2 34.01 kg/m2  35.25 kg/m2       No data to display            Obesity (BMI 30.0-34.9)  Patient re-educated about  the importance of commitment to a  minimum of 150 minutes of exercise per week as able.  The importance of healthy food choices with portion control discussed, as well as eating regularly and within a 12 hour window most days. The need to choose "clean , green" food 50 to 75% of the time is  discussed, as well as to make water the primary drink and set a goal of 64 ounces water daily.       12/17/2022    4:25 PM 12/03/2022    7:50 AM 11/19/2022    4:00 PM  Weight /BMI  Weight 189 lb 192 lb 199 lb  Height '5\' 3"'$  (1.6 m) '5\' 3"'$  (1.6 m) '5\' 3"'$  (1.6 m)  BMI 33.48 kg/m2 34.01 kg/m2 35.25 kg/m2    Slight improvement

## 2022-12-19 NOTE — Assessment & Plan Note (Signed)
Controlled, no change in medication DASH diet and commitment to daily physical activity for a minimum of 30 minutes discussed and encouraged, as a part of hypertension management. The importance of attaining a healthy weight is also discussed.     12/17/2022    4:44 PM 12/17/2022    4:43 PM 12/17/2022    4:26 PM 12/17/2022    4:25 PM 12/03/2022    7:50 AM 11/19/2022    4:50 PM 11/19/2022    4:00 PM  BP/Weight  Systolic BP 299 242 683 419 622 297 989  Diastolic BP 82 72 74 68 94 100 102  Wt. (Lbs)    189 192  199  BMI    33.48 kg/m2 34.01 kg/m2  35.25 kg/m2

## 2023-01-14 ENCOUNTER — Ambulatory Visit (HOSPITAL_COMMUNITY)
Admission: RE | Admit: 2023-01-14 | Discharge: 2023-01-14 | Disposition: A | Discharge status: Home / Self Care | Payer: BC Managed Care – PPO | Source: Ambulatory Visit | Attending: Internal Medicine | Admitting: Internal Medicine

## 2023-01-14 DIAGNOSIS — I10 Essential (primary) hypertension: Secondary | ICD-10-CM | POA: Diagnosis not present

## 2023-01-14 LAB — ECHOCARDIOGRAM COMPLETE
AR max vel: 2.35 cm2
AV Area VTI: 2.7 cm2
AV Area mean vel: 2.49 cm2
AV Mean grad: 2.1 mmHg
AV Peak grad: 3.9 mmHg
Ao pk vel: 0.98 m/s
Area-P 1/2: 4.06 cm2
Calc EF: 64 %
MV VTI: 2.53 cm2
S' Lateral: 4.4 cm
Single Plane A2C EF: 57.1 %
Single Plane A4C EF: 70.1 %

## 2023-01-14 MED ORDER — PERFLUTREN LIPID MICROSPHERE
1.0000 mL | INTRAVENOUS | Status: AC | PRN
  Administered 2023-01-14: 2 mL via INTRAVENOUS

## 2023-01-14 NOTE — Progress Notes (Signed)
  Echocardiogram 2D Echocardiogram has been performed.  Kari Mason 01/14/2023, 9:29 AM

## 2023-05-18 ENCOUNTER — Ambulatory Visit: Payer: BC Managed Care – PPO | Attending: Internal Medicine | Admitting: Internal Medicine

## 2023-05-18 NOTE — Progress Notes (Signed)
Erroneous encounter - please disregard.

## 2023-05-19 ENCOUNTER — Encounter: Payer: Self-pay | Admitting: Internal Medicine

## 2023-06-17 ENCOUNTER — Ambulatory Visit: Payer: BC Managed Care – PPO | Admitting: Family Medicine

## 2023-06-17 ENCOUNTER — Encounter: Payer: Self-pay | Admitting: Family Medicine

## 2023-06-17 VITALS — BP 128/84 | HR 82 | Resp 16 | Ht 63.0 in | Wt 190.1 lb

## 2023-06-17 DIAGNOSIS — R7302 Impaired glucose tolerance (oral): Secondary | ICD-10-CM

## 2023-06-17 DIAGNOSIS — J302 Other seasonal allergic rhinitis: Secondary | ICD-10-CM | POA: Diagnosis not present

## 2023-06-17 DIAGNOSIS — I10 Essential (primary) hypertension: Secondary | ICD-10-CM | POA: Diagnosis not present

## 2023-06-17 DIAGNOSIS — E669 Obesity, unspecified: Secondary | ICD-10-CM

## 2023-06-17 NOTE — Patient Instructions (Signed)
Annual exam in December , call if you need me sooner  Labs HBA1C, chem 7 and EGFR  We will mail December labs, pls get 3 too 5 days before appt  Weight loss goal of 7 to 10  Thanks for choosing [facility], we consider it a privelige to serve you.

## 2023-06-18 ENCOUNTER — Encounter: Payer: Self-pay | Admitting: Family Medicine

## 2023-06-18 LAB — HEMOGLOBIN A1C
Est. average glucose Bld gHb Est-mCnc: 131 mg/dL
Hgb A1c MFr Bld: 6.2 % — ABNORMAL HIGH (ref 4.8–5.6)

## 2023-06-18 LAB — BMP8+EGFR
BUN/Creatinine Ratio: 27 — ABNORMAL HIGH (ref 9–23)
BUN: 22 mg/dL (ref 6–24)
CO2: 26 mmol/L (ref 20–29)
Calcium: 9.7 mg/dL (ref 8.7–10.2)
Chloride: 102 mmol/L (ref 96–106)
Creatinine, Ser: 0.83 mg/dL (ref 0.57–1.00)
Glucose: 98 mg/dL (ref 70–99)
Potassium: 3.7 mmol/L (ref 3.5–5.2)
Sodium: 141 mmol/L (ref 134–144)
eGFR: 82 mL/min/{1.73_m2} (ref >59)

## 2023-06-18 NOTE — Assessment & Plan Note (Signed)
Controlled, no change in medication DASH diet and commitment to daily physical activity for a minimum of 30 minutes discussed and encouraged, as a part of hypertension management. The importance of attaining a healthy weight is also discussed.     06/17/2023    4:36 PM 06/17/2023    4:11 PM 12/17/2022    4:44 PM 12/17/2022    4:43 PM 12/17/2022    4:26 PM 12/17/2022    4:25 PM 12/03/2022    7:50 AM  BP/Weight  Systolic BP 128 136 124 128 133 140 142  Diastolic BP 84 79 82 72 74 68 94  Wt. (Lbs)  190.12    189 192  BMI  33.68 kg/m2    33.48 kg/m2 34.01 kg/m2

## 2023-06-18 NOTE — Assessment & Plan Note (Signed)
  Patient re-educated about  the importance of commitment to a  minimum of 150 minutes of exercise per week as able.  The importance of healthy food choices with portion control discussed, as well as eating regularly and within a 12 hour window most days. The need to choose "clean , green" food 50 to 75% of the time is discussed, as well as to make water the primary drink and set a goal of 64 ounces water daily.       06/17/2023    4:11 PM 12/17/2022    4:25 PM 12/03/2022    7:50 AM  Weight /BMI  Weight 190 lb 1.9 oz 189 lb 192 lb  Height 5\' 3"  (1.6 m) 5\' 3"  (1.6 m) 5\' 3"  (1.6 m)  BMI 33.68 kg/m2 33.48 kg/m2 34.01 kg/m2    unchanged

## 2023-06-18 NOTE — Assessment & Plan Note (Signed)
Deteriorated  Patient educated about the importance of limiting  Carbohydrate intake , the need to commit to daily physical activity for a minimum of 30 minutes , and to commit weight loss. The fact that changes in all these areas will reduce or eliminate all together the development of diabetes is stressed.      Latest Ref Rng & Units 06/17/2023    4:41 PM 12/17/2022    8:43 AM 08/06/2022    8:31 AM 02/05/2022    8:38 AM 11/24/2020    8:50 AM  Diabetic Labs  HbA1c 4.8 - 5.6 % 6.2  6.1  6.0  6.0  5.5   Chol 100 - 199 mg/dL   161  096  045   HDL >40 mg/dL   64  68  63   Calc LDL 0 - 99 mg/dL   981  191  478   Triglycerides 0 - 149 mg/dL   295  53  83   Creatinine 0.57 - 1.00 mg/dL 6.21  3.08  6.57  8.46  0.61       06/17/2023    4:36 PM 06/17/2023    4:11 PM 12/17/2022    4:44 PM 12/17/2022    4:43 PM 12/17/2022    4:26 PM 12/17/2022    4:25 PM 12/03/2022    7:50 AM  BP/Weight  Systolic BP 128 136 124 128 133 140 142  Diastolic BP 84 79 82 72 74 68 94  Wt. (Lbs)  190.12    189 192  BMI  33.68 kg/m2    33.48 kg/m2 34.01 kg/m2       No data to display

## 2023-06-18 NOTE — Assessment & Plan Note (Signed)
Controlled, no change in medication  

## 2023-06-18 NOTE — Progress Notes (Signed)
Kari Mason     MRN: [id number]      DOB: [birth year]  Chief Complaint  Patient presents with   Hypertension    Follow up visit     HPI Kari Mason is here for follow up and re-evaluation of chronic medical conditions, medication management and review of any available recent lab and radiology data.  Preventive health is updated, specifically  Cancer screening and Immunization. Still refusing shingles vaccine  Questions or concerns regarding consultations or procedures which the PT has had in the interim are  addressed. The PT denies any adverse reactions to current medications since the last visit.  There are no new concerns.  There are no specific complaints   ROS Denies recent fever or chills. Denies sinus pressure, nasal congestion, ear pain or sore throat. Denies chest congestion, productive cough or wheezing. Denies chest pains, palpitations and leg swelling Denies abdominal pain, nausea, vomiting,diarrhea or constipation.   Denies dysuria, frequency, hesitancy or incontinence. Denies joint pain, swelling and limitation in mobility. Denies headaches, seizures, numbness, or tingling. Denies depression, anxiety or insomnia. Denies skin break down or rash.   PE  BP 128/84   Pulse 82   Resp 16   Ht 5\' 3"  (1.6 m)   Wt 190 lb 1.9 oz (86.2 kg)   LMP 11/16/2018 (Approximate)   SpO2 96%   BMI 33.68 kg/m   Patient alert and oriented and in no cardiopulmonary distress.  HEENT: No facial asymmetry, EOMI,     Neck supple .  Chest: Clear to auscultation bilaterally.  CVS: S1, S2 no murmurs, no S3.Regular rate.  ABD: Soft non tender.   Ext: No edema  MS: Adequate ROM spine, shoulders, hips and knees.  Skin: Intact, no ulcerations or rash noted.  Psych: Good eye contact, normal affect. Memory intact not anxious or depressed appearing.  CNS: CN 2-12 intact, power,  normal throughout.no focal deficits noted.   Assessment & Plan  Essential hypertension Controlled,  no change in medication DASH diet and commitment to daily physical activity for a minimum of 30 minutes discussed and encouraged, as a part of hypertension management. The importance of attaining a healthy weight is also discussed.     06/17/2023    4:36 PM 06/17/2023    4:11 PM 12/17/2022    4:44 PM 12/17/2022    4:43 PM 12/17/2022    4:26 PM 12/17/2022    4:25 PM 12/03/2022    7:50 AM  BP/Weight  Systolic BP 128 136 124 128 133 140 142  Diastolic BP 84 79 82 72 74 68 94  Wt. (Lbs)  190.12    189 192  BMI  33.68 kg/m2    33.48 kg/m2 34.01 kg/m2       IGT (impaired glucose tolerance) Deteriorated  Patient educated about the importance of limiting  Carbohydrate intake , the need to commit to daily physical activity for a minimum of 30 minutes , and to commit weight loss. The fact that changes in all these areas will reduce or eliminate all together the development of diabetes is stressed.      Latest Ref Rng & Units 06/17/2023    4:41 PM 12/17/2022    8:43 AM 08/06/2022    8:31 AM 02/05/2022    8:38 AM 11/24/2020    8:50 AM  Diabetic Labs  HbA1c 4.8 - 5.6 % 6.2  6.1  6.0  6.0  5.5   Chol 100 - 199 mg/dL   409  811  193   HDL >39 mg/dL   64  68  63   Calc LDL 0 - 99 mg/dL   161  096  045   Triglycerides 0 - 149 mg/dL   409  53  83   Creatinine 0.57 - 1.00 mg/dL 8.11  9.14  7.82  9.56  0.61       06/17/2023    4:36 PM 06/17/2023    4:11 PM 12/17/2022    4:44 PM 12/17/2022    4:43 PM 12/17/2022    4:26 PM 12/17/2022    4:25 PM 12/03/2022    7:50 AM  BP/Weight  Systolic BP 128 136 124 128 133 140 142  Diastolic BP 84 79 82 72 74 68 94  Wt. (Lbs)  190.12    189 192  BMI  33.68 kg/m2    33.48 kg/m2 34.01 kg/m2       No data to display            Allergic rhinitis Controlled, no change in medication   Obesity (BMI 30.0-34.9)  Patient re-educated about  the importance of commitment to a  minimum of 150 minutes of exercise per week as able.  The importance of healthy  food choices with portion control discussed, as well as eating regularly and within a 12 hour window most days. The need to choose "clean , green" food 50 to 75% of the time is discussed, as well as to make water the primary drink and set a goal of 64 ounces water daily.       06/17/2023    4:11 PM 12/17/2022    4:25 PM 12/03/2022    7:50 AM  Weight /BMI  Weight 190 lb 1.9 oz 189 lb 192 lb  Height 5\' 3"  (1.6 m) 5\' 3"  (1.6 m) 5\' 3"  (1.6 m)  BMI 33.68 kg/m2 33.48 kg/m2 34.01 kg/m2    unchanged

## 2023-06-20 ENCOUNTER — Other Ambulatory Visit: Payer: Self-pay

## 2023-06-20 DIAGNOSIS — I10 Essential (primary) hypertension: Secondary | ICD-10-CM

## 2023-06-20 DIAGNOSIS — E559 Vitamin D deficiency, unspecified: Secondary | ICD-10-CM

## 2023-06-20 DIAGNOSIS — Z13220 Encounter for screening for lipoid disorders: Secondary | ICD-10-CM

## 2023-06-20 DIAGNOSIS — R7302 Impaired glucose tolerance (oral): Secondary | ICD-10-CM

## 2023-06-20 DIAGNOSIS — Z1231 Encounter for screening mammogram for malignant neoplasm of breast: Secondary | ICD-10-CM

## 2023-08-04 ENCOUNTER — Ambulatory Visit
Admission: RE | Admit: 2023-08-04 | Discharge: 2023-08-04 | Disposition: A | Discharge status: Home / Self Care | Payer: BC Managed Care – PPO | Source: Ambulatory Visit | Attending: Family Medicine | Admitting: Family Medicine

## 2023-08-04 DIAGNOSIS — Z1231 Encounter for screening mammogram for malignant neoplasm of breast: Secondary | ICD-10-CM

## 2023-11-05 ENCOUNTER — Other Ambulatory Visit: Payer: Self-pay | Admitting: Family Medicine

## 2023-11-19 LAB — HEMOGLOBIN A1C
Est. average glucose Bld gHb Est-mCnc: 114 mg/dL
Hgb A1c MFr Bld: 5.6 % (ref 4.8–5.6)

## 2023-11-19 LAB — TSH: TSH: 1.24 u[IU]/mL (ref 0.450–4.500)

## 2023-11-19 LAB — CMP14+EGFR
ALT: 9 [IU]/L (ref 0–32)
AST: 12 [IU]/L (ref 0–40)
Albumin: 4.3 g/dL (ref 3.8–4.9)
Alkaline Phosphatase: 69 [IU]/L (ref 44–121)
BUN/Creatinine Ratio: 17 (ref 9–23)
BUN: 14 mg/dL (ref 6–24)
Bilirubin Total: 0.2 mg/dL (ref 0.0–1.2)
CO2: 24 mmol/L (ref 20–29)
Calcium: 9.7 mg/dL (ref 8.7–10.2)
Chloride: 103 mmol/L (ref 96–106)
Creatinine, Ser: 0.84 mg/dL (ref 0.57–1.00)
Globulin, Total: 1.9 g/dL (ref 1.5–4.5)
Glucose: 80 mg/dL (ref 70–99)
Potassium: 4.1 mmol/L (ref 3.5–5.2)
Sodium: 142 mmol/L (ref 134–144)
Total Protein: 6.2 g/dL (ref 6.0–8.5)
eGFR: 80 mL/min/{1.73_m2} (ref >59)

## 2023-11-19 LAB — LIPID PANEL
Chol/HDL Ratio: 2.3 {ratio} (ref 0.0–4.4)
Cholesterol, Total: 173 mg/dL (ref 100–199)
HDL: 75 mg/dL (ref >39)
LDL Chol Calc (NIH): 83 mg/dL (ref 0–99)
Triglycerides: 84 mg/dL (ref 0–149)
VLDL Cholesterol Cal: 15 mg/dL (ref 5–40)

## 2023-11-19 LAB — CBC
Hematocrit: 38.8 % (ref 34.0–46.6)
Hemoglobin: 12.9 g/dL (ref 11.1–15.9)
MCH: 30.4 pg (ref 26.6–33.0)
MCHC: 33.2 g/dL (ref 31.5–35.7)
MCV: 92 fL (ref 79–97)
Platelets: 286 10*3/uL (ref 150–450)
RBC: 4.24 x10E6/uL (ref 3.77–5.28)
RDW: 12.5 % (ref 11.7–15.4)
WBC: 10.1 10*3/uL (ref 3.4–10.8)

## 2023-11-19 LAB — VITAMIN D 25 HYDROXY (VIT D DEFICIENCY, FRACTURES): Vit D, 25-Hydroxy: 15.2 ng/mL — ABNORMAL LOW (ref 30.0–100.0)

## 2023-11-22 ENCOUNTER — Ambulatory Visit (INDEPENDENT_AMBULATORY_CARE_PROVIDER_SITE_OTHER): Payer: BC Managed Care – PPO | Admitting: Family Medicine

## 2023-11-22 ENCOUNTER — Encounter: Payer: Self-pay | Admitting: Family Medicine

## 2023-11-22 VITALS — BP 132/85 | HR 92 | Ht 63.0 in | Wt 185.0 lb

## 2023-11-22 DIAGNOSIS — M79641 Pain in right hand: Secondary | ICD-10-CM | POA: Insufficient documentation

## 2023-11-22 DIAGNOSIS — Z0000 Encounter for general adult medical examination without abnormal findings: Secondary | ICD-10-CM | POA: Insufficient documentation

## 2023-11-22 DIAGNOSIS — M79642 Pain in left hand: Secondary | ICD-10-CM | POA: Diagnosis not present

## 2023-11-22 MED ORDER — MELOXICAM 15 MG PO TABS: ORAL_TABLET | ORAL | 0 refills | Status: DC

## 2023-11-22 MED ORDER — LORATADINE 10 MG PO TABS: 10.0000 mg | ORAL_TABLET | Freq: Every day | ORAL | 2 refills | Status: DC

## 2023-11-22 MED ORDER — VITAMIN D (ERGOCALCIFEROL) 1.25 MG (50000 UNIT) PO CAPS: 50000.0000 [IU] | ORAL_CAPSULE | ORAL | 2 refills | Status: DC

## 2023-11-22 NOTE — Assessment & Plan Note (Signed)
Meloxicam prescribed for limited use as needed, for flare of pain

## 2023-11-22 NOTE — Progress Notes (Signed)
    Kari Mason     MRN: [id number]      DOB: 1966/02/19  Chief Complaint  Patient presents with   Annual Exam    CPE     HPI: Patient is in for annual physical exam. C/o bilateral hand pain and deformity of joints, uases hands a lot on the work has stiffness at times as qell. Recent labs,  are reviewed. Immunization is reviewed , and  updated if needed.   PE: BP 132/85 (BP Location: Right Arm, Patient Position: Sitting, Cuff Size: Large)   Pulse 92   Ht 5\' 3"  (1.6 m)   Wt 185 lb (83.9 kg)   LMP 11/16/2018 (Approximate)   SpO2 97%   BMI 32.77 kg/m   Pleasant  female, alert and oriented x 3, in no cardio-pulmonary distress. Afebrile. HEENT No facial trauma or asymetry. Sinuses non tender.  Extra occullar muscles intact.. External ears normal, . Neck: supple, no adenopathy,JVD or thyromegaly.No bruits.  Chest: Clear to ascultation bilaterally.No crackles or wheezes. Non tender to palpation  BCardiovascular system; Heart sounds normal,  S1 and  S2 ,no S3.  No murmur, or thrill. Apical beat not displaced Peripheral pulses normal.  Abdomen: Soft, non tender,    Musculoskeletal exam: Full ROM of spine, hips , shoulders and knees. Mild  deformity of digits of left hand. No muscle wasting or atrophy.   Neurologic: Cranial nerves 2 to 12 intact. Power, tone ,sensation and reflexes normal throughout. No disturbance in gait. No tremor.  Skin: Intact, no ulceration, erythema , scaling or rash noted. Pigmentation normal throughout  Psych; Normal mood and affect. Judgement and concentration normal   Assessment & Plan:  Encounter for annual physical exam /Annual exam as documented.  Immunization and cancer screening needs are specifically addressed at this visit.Needs colonoscopu in 2025, refuses vaccines   Bilateral hand pain Meloxicam prescribed for limited use as needed, for flare of pain

## 2023-11-22 NOTE — Patient Instructions (Signed)
F/u in 6 monhts, call if you need me sooner  Excellent labs and congrats on weight loss   Blood pressure slightly elevated , watch salt , increase vegetables   And fruit  Blood sugar is excellent  Meloxicam is prescribed for arthritis pain, [place] e no more than 3 times per week

## 2023-11-22 NOTE — Assessment & Plan Note (Signed)
/  Annual exam as documented.  Immunization and cancer screening needs are specifically addressed at this visit.Needs colonoscopu in 2025, refuses vaccines

## 2023-11-23 ENCOUNTER — Encounter (INDEPENDENT_AMBULATORY_CARE_PROVIDER_SITE_OTHER): Payer: Self-pay | Admitting: *Deleted

## 2024-01-28 ENCOUNTER — Other Ambulatory Visit: Payer: Self-pay | Admitting: Family Medicine

## 2024-02-02 ENCOUNTER — Other Ambulatory Visit: Payer: Self-pay | Admitting: Family Medicine

## 2024-04-09 ENCOUNTER — Other Ambulatory Visit: Payer: Self-pay | Admitting: Family Medicine

## 2024-04-09 ENCOUNTER — Telehealth (INDEPENDENT_AMBULATORY_CARE_PROVIDER_SITE_OTHER): Payer: Self-pay | Admitting: Gastroenterology

## 2024-04-09 NOTE — Telephone Encounter (Signed)
 Who is your primary care physician: Dr.Simpson  Reasons for the colonoscopy:   Have you had a colonoscopy before?  Yes turn 49  Do you have family history of colon cancer?   Previous colonoscopy with polyps removed? Yes turn 49  Do you have a history colorectal cancer?   no  Are you diabetic? If yes, Type 1 or Type 2?    no  Do you have a prosthetic or mechanical heart valve? no  Do you have a pacemaker/defibrillator?   no  Have you had endocarditis/atrial fibrillation? no  Have you had joint replacement within the last 12 months?  no  Do you tend to be constipated or have to use laxatives? no  Do you have any history of drugs or alchohol?  no  Do you use supplemental oxygen?  no  Have you had a stroke or heart attack within the last 6 months? no  Do you take weight loss medication?  no  For female patients: have you had a hysterectomy?  no                                     are you post menopausal?       no                                            do you still have your menstrual cycle? no      Do you take any blood-thinning medications such as: (aspirin, warfarin, Plavix, Aggrenox)    If yes we need the name, milligram, dosage and who is prescribing doctor  Current Outpatient Medications on File Prior to Visit  Medication Sig Dispense Refill   amLODipine  (NORVASC ) 5 MG tablet Take 1 tablet by mouth once daily 90 tablet 0   loratadine  (CLARITIN ) 10 MG tablet Take 1 tablet (10 mg total) by mouth daily. 90 tablet 2   meloxicam  (MOBIC ) 15 MG tablet TAKE 1 TABLET BY MOUTH THREE TIMES A WEEK AS NEEDED FOR  UNCONTROLLED  PAIN. 30 tablet 0   Multiple Vitamin (MULTIVITAMIN WITH MINERALS) TABS tablet Take 1 tablet by mouth daily.     triamterene -hydrochlorothiazide  (MAXZIDE) 75-50 MG tablet Take 1 tablet by mouth once daily 90 tablet 0   Vitamin D , Ergocalciferol , (DRISDOL ) 1.25 MG (50000 UNIT) CAPS capsule Take 1 capsule (50,000 Units total) by mouth every 7 (seven) days. 12  capsule 2   No current facility-administered medications on file prior to visit.    Allergies  Allergen Reactions   Lisinopril  Cough     Ph[facility]: [facility]  Primary Insurance Name: [employer]  Best number where you can be reached: [phone]

## 2024-04-17 NOTE — Telephone Encounter (Signed)
Ok to schedule.  Room : any   Thanks,  Vista Lawman, MD Gastroenterology and Hepatology [facility]

## 2024-04-17 NOTE — Telephone Encounter (Signed)
 Left message to return call

## 2024-04-19 NOTE — Telephone Encounter (Signed)
 Left message to return call

## 2024-04-20 NOTE — Telephone Encounter (Signed)
 Pt contacted. Pt states she will need to call back after she speaks with her daughter whom will be bringing her. Pt also states she was not able to tolerate gallon jug prep. Pt will need low volume prep.

## 2024-05-02 ENCOUNTER — Other Ambulatory Visit: Payer: Self-pay | Admitting: Family Medicine

## 2024-05-22 ENCOUNTER — Encounter: Payer: Self-pay | Admitting: Family Medicine

## 2024-05-22 ENCOUNTER — Ambulatory Visit: Payer: BC Managed Care – PPO | Admitting: Family Medicine

## 2024-05-22 VITALS — BP 136/84 | HR 87 | Resp 16 | Ht 63.0 in | Wt 192.1 lb

## 2024-05-22 DIAGNOSIS — E66811 Obesity, class 1: Secondary | ICD-10-CM

## 2024-05-22 DIAGNOSIS — I10 Essential (primary) hypertension: Secondary | ICD-10-CM

## 2024-05-22 DIAGNOSIS — Z1231 Encounter for screening mammogram for malignant neoplasm of breast: Secondary | ICD-10-CM

## 2024-05-22 DIAGNOSIS — R7989 Other specified abnormal findings of blood chemistry: Secondary | ICD-10-CM | POA: Diagnosis not present

## 2024-05-22 DIAGNOSIS — J302 Other seasonal allergic rhinitis: Secondary | ICD-10-CM | POA: Diagnosis not present

## 2024-05-22 DIAGNOSIS — D126 Benign neoplasm of colon, unspecified: Secondary | ICD-10-CM

## 2024-05-22 MED ORDER — LORATADINE 10 MG PO TABS: 10.0000 mg | ORAL_TABLET | Freq: Every day | ORAL | 3 refills | Status: DC

## 2024-05-22 MED ORDER — MELOXICAM 15 MG PO TABS: ORAL_TABLET | ORAL | 3 refills | Status: DC

## 2024-05-22 NOTE — Assessment & Plan Note (Signed)
  Patient re-educated about  the importance of commitment to a  minimum of 150 minutes of exercise per week as able.  The importance of healthy food choices with portion control discussed, as well as eating regularly and within a 12 hour window most days. The need to choose clean , green food 50 to 75% of the time is discussed, as well as to make water  the primary drink and set a goal of 64 ounces water  daily.       05/22/2024    4:04 PM 11/22/2023    4:18 PM 06/17/2023    4:11 PM  Weight /BMI  Weight 192 lb 1.9 oz 185 lb 190 lb 1.9 oz  Height 5' 3 (1.6 m) 5' 3 (1.6 m) 5' 3 (1.6 m)  BMI 34.03 kg/m2 32.77 kg/m2 33.68 kg/m2    Deteriorated needs to increase veges and fruit and drink water

## 2024-05-22 NOTE — Assessment & Plan Note (Signed)
 Med refilled, uses as needed, and is controlled

## 2024-05-22 NOTE — Patient Instructions (Addendum)
 Annual exam 12/18 or after, call if t you need me sooner  Labs today Vit D and kidney function, will determine if you need to stayn on this dose of Vit D bassed on lab and let you know  Please schedule mammogram at checkout  It is important that you exercise regularly at least 30 minutes 5 times a week. If you develop chest pain, have severe difficulty breathing, or feel very tired, stop exercising immediately and seek medical attention   Think about what you will eat, plan ahead. Choose  clean, green, fresh or frozen over canned, processed or packaged foods which are more sugary, salty and fatty. 70 to 75% of food eaten should be vegetables and fruit. Three meals at set times with snacks allowed between meals, but they must be fruit or vegetables. Aim to eat over a 12 hour period , example 7 am to 7 pm, and STOP after  your last meal of the day. Drink water ,generally about 64 ounces per day, no other drink is as healthy. Fruit juice is best enjoyed in a healthy way, by EATING the fruit. Thanks for choosing [facility], we consider it a privelige to serve you.

## 2024-05-22 NOTE — Assessment & Plan Note (Signed)
 Controlled, no change in medication DASH diet and commitment to daily physical activity for a minimum of 30 minutes discussed and encouraged, as a part of hypertension management. The importance of attaining a healthy weight is also discussed.     05/22/2024    4:35 PM 05/22/2024    4:04 PM 11/22/2023    4:18 PM 06/17/2023    4:36 PM 06/17/2023    4:11 PM 12/17/2022    4:44 PM 12/17/2022    4:43 PM  BP/Weight  Systolic BP 136 158 132 128 136 124 128  Diastolic BP 84 84 85 84 79 82 72  Wt. (Lbs)  192.12 185  190.12    BMI  34.03 kg/m2 32.77 kg/m2  33.68 kg/m2

## 2024-05-22 NOTE — Assessment & Plan Note (Signed)
 To have completed colonoscopy in next 4 months

## 2024-05-22 NOTE — Assessment & Plan Note (Addendum)
 Updated lab needed at/ before next visit.Med management will be based on lab

## 2024-05-22 NOTE — Progress Notes (Signed)
 Kari Mason     MRN: [id number]      DOB: Jun 19, 1966  Chief Complaint  Patient presents with   Hypertension    Follow up     HPI Kari Mason is here for follow up and re-evaluation of chronic medical conditions, medication management and review of any available recent lab and radiology data.  Preventive health is updated, specifically  Cancer screening and Immunization.   Questions or concerns regarding consultations or procedures which the PT has had in the interim are  addressed. The PT denies any adverse reactions to current medications since the last visit.  There are no new concerns.  There are no specific complaints   ROS Denies recent fever or chills. Denies sinus pressure, nasal congestion, ear pain or sore throat. Denies chest congestion, productive cough or wheezing. Denies chest pains, palpitations and leg swelling Denies abdominal pain, nausea, vomiting,diarrhea or constipation.   Denies dysuria, frequency, hesitancy or incontinence. Denies joint pain, swelling and limitation in mobility. Denies headaches, seizures, numbness, or tingling. Denies depression, anxiety or insomnia. Denies skin break down or rash.   PE  BP 136/84   Pulse 87   Resp 16   Ht 5' 3 (1.6 m)   Wt 192 lb 1.9 oz (87.1 kg)   LMP 11/16/2018 (Approximate)   SpO2 96%   BMI 34.03 kg/m   Patient alert and oriented and in no cardiopulmonary distress.  HEENT: No facial asymmetry, EOMI,     Neck supple .  Chest: Clear to auscultation bilaterally.  CVS: S1, S2 no murmurs, no S3.Regular rate.  ABD: Soft non tender.   Ext: No edema  MS: Adequate ROM spine, shoulders, hips and knees.  Skin: Intact, no ulcerations or rash noted.  Psych: Good eye contact, normal affect. Memory intact not anxious or depressed appearing.  CNS: CN 2-12 intact, power,  normal throughout.no focal deficits noted.   Assessment & Plan  Essential hypertension Controlled, no change in medication DASH diet  and commitment to daily physical activity for a minimum of 30 minutes discussed and encouraged, as a part of hypertension management. The importance of attaining a healthy weight is also discussed.     05/22/2024    4:35 PM 05/22/2024    4:04 PM 11/22/2023    4:18 PM 06/17/2023    4:36 PM 06/17/2023    4:11 PM 12/17/2022    4:44 PM 12/17/2022    4:43 PM  BP/Weight  Systolic BP 136 158 132 128 136 124 128  Diastolic BP 84 84 85 84 79 82 72  Wt. (Lbs)  192.12 185  190.12    BMI  34.03 kg/m2 32.77 kg/m2  33.68 kg/m2         Low vitamin D  level Updated lab needed at/ before next visit.Med management will be based on lab   Allergic rhinitis Med refilled, uses as needed, and is controlled   Obesity (BMI 30.0-34.9)  Patient re-educated about  the importance of commitment to a  minimum of 150 minutes of exercise per week as able.  The importance of healthy food choices with portion control discussed, as well as eating regularly and within a 12 hour window most days. The need to choose clean , green food 50 to 75% of the time is discussed, as well as to make water  the primary drink and set a goal of 64 ounces water  daily.       05/22/2024    4:04 PM 11/22/2023    4:18  PM 06/17/2023    4:11 PM  Weight /BMI  Weight 192 lb 1.9 oz 185 lb 190 lb 1.9 oz  Height 5' 3 (1.6 m) 5' 3 (1.6 m) 5' 3 (1.6 m)  BMI 34.03 kg/m2 32.77 kg/m2 33.68 kg/m2    Deteriorated needs to increase veges and fruit and drink water   Tubular adenoma of colon To have completed colonoscopy in next 4 months

## 2024-05-23 ENCOUNTER — Ambulatory Visit: Payer: Self-pay | Admitting: Family Medicine

## 2024-05-23 LAB — BASIC METABOLIC PANEL WITH GFR
BUN/Creatinine Ratio: 31 — ABNORMAL HIGH (ref 9–23)
BUN: 21 mg/dL (ref 6–24)
CO2: 24 mmol/L (ref 20–29)
Calcium: 9.9 mg/dL (ref 8.7–10.2)
Chloride: 99 mmol/L (ref 96–106)
Creatinine, Ser: 0.67 mg/dL (ref 0.57–1.00)
Glucose: 105 mg/dL — ABNORMAL HIGH (ref 70–99)
Potassium: 3.7 mmol/L (ref 3.5–5.2)
Sodium: 139 mmol/L (ref 134–144)
eGFR: 101 mL/min/{1.73_m2} (ref >59)

## 2024-05-23 LAB — VITAMIN D 25 HYDROXY (VIT D DEFICIENCY, FRACTURES): Vit D, 25-Hydroxy: 71.1 ng/mL (ref 30.0–100.0)

## 2024-05-23 NOTE — Addendum Note (Signed)
 Addended by: Towanda Fret on: 05/23/2024 08:14 PM   Modules accepted: Orders

## 2024-06-01 ENCOUNTER — Telehealth (INDEPENDENT_AMBULATORY_CARE_PROVIDER_SITE_OTHER): Payer: Self-pay | Admitting: Gastroenterology

## 2024-06-01 NOTE — Telephone Encounter (Signed)
 Pt called to schedule her colonoscopy. She turned her questionnaire in back in May 2025. [phone]

## 2024-06-05 NOTE — Telephone Encounter (Signed)
 LMOVM to call back to schedule per previous triage

## 2024-06-12 NOTE — Telephone Encounter (Signed)
 LMOVM to return call  Pt left vm returning call

## 2024-06-15 ENCOUNTER — Encounter: Payer: Self-pay | Admitting: *Deleted

## 2024-06-15 ENCOUNTER — Other Ambulatory Visit: Payer: Self-pay | Admitting: *Deleted

## 2024-06-15 DIAGNOSIS — Z860100 Personal history of colon polyps, unspecified: Secondary | ICD-10-CM

## 2024-06-15 NOTE — Telephone Encounter (Signed)
 Pt has been scheduled for 07/23/24. Pt states she can't do the white prep. Instructions for miralax prep mailed to pt.

## 2024-06-18 NOTE — Telephone Encounter (Signed)
 Questionnaire from recall, no referral needed

## 2024-07-13 ENCOUNTER — Other Ambulatory Visit: Payer: Self-pay

## 2024-07-13 DIAGNOSIS — Z860100 Personal history of colon polyps, unspecified: Secondary | ICD-10-CM

## 2024-07-14 LAB — BASIC METABOLIC PANEL WITH GFR
BUN/Creatinine Ratio: 21 (ref 9–23)
BUN: 14 mg/dL (ref 6–24)
CO2: 22 mmol/L (ref 20–29)
Calcium: 9.9 mg/dL (ref 8.7–10.2)
Chloride: 99 mmol/L (ref 96–106)
Creatinine, Ser: 0.67 mg/dL (ref 0.57–1.00)
Glucose: 108 mg/dL — ABNORMAL HIGH (ref 70–99)
Potassium: 4.4 mmol/L (ref 3.5–5.2)
Sodium: 138 mmol/L (ref 134–144)
eGFR: 101 mL/min/1.73 (ref >59)

## 2024-07-23 ENCOUNTER — Encounter (HOSPITAL_COMMUNITY)
Admission: RE | Disposition: A | Discharge status: Home / Self Care | Payer: Self-pay | Source: Home / Self Care | Attending: Gastroenterology

## 2024-07-23 ENCOUNTER — Encounter (INDEPENDENT_AMBULATORY_CARE_PROVIDER_SITE_OTHER): Payer: Self-pay | Admitting: *Deleted

## 2024-07-23 ENCOUNTER — Other Ambulatory Visit: Payer: Self-pay

## 2024-07-23 ENCOUNTER — Ambulatory Visit (HOSPITAL_COMMUNITY)
Admission: RE | Admit: 2024-07-23 | Discharge: 2024-07-23 | Disposition: A | Discharge status: Home / Self Care | Attending: Gastroenterology | Admitting: Gastroenterology

## 2024-07-23 ENCOUNTER — Ambulatory Visit (HOSPITAL_COMMUNITY): Admitting: Anesthesiology

## 2024-07-23 ENCOUNTER — Encounter (HOSPITAL_COMMUNITY): Payer: Self-pay | Admitting: Gastroenterology

## 2024-07-23 DIAGNOSIS — Z860101 Personal history of adenomatous and serrated colon polyps: Secondary | ICD-10-CM | POA: Diagnosis not present

## 2024-07-23 DIAGNOSIS — I10 Essential (primary) hypertension: Secondary | ICD-10-CM | POA: Diagnosis not present

## 2024-07-23 DIAGNOSIS — Z1211 Encounter for screening for malignant neoplasm of colon: Secondary | ICD-10-CM | POA: Insufficient documentation

## 2024-07-23 DIAGNOSIS — K641 Second degree hemorrhoids: Secondary | ICD-10-CM

## 2024-07-23 DIAGNOSIS — K648 Other hemorrhoids: Secondary | ICD-10-CM | POA: Insufficient documentation

## 2024-07-23 HISTORY — PX: COLONOSCOPY: SHX5424

## 2024-07-23 LAB — HM COLONOSCOPY

## 2024-07-23 SURGERY — COLONOSCOPY
Anesthesia: General

## 2024-07-23 MED ORDER — PROPOFOL 500 MG/50ML IV EMUL
INTRAVENOUS | Status: DC | PRN
  Administered 2024-07-23: 125 ug/kg/min via INTRAVENOUS
  Administered 2024-07-23: 60 mg via INTRAVENOUS

## 2024-07-23 MED ORDER — LACTATED RINGERS IV SOLN: INTRAVENOUS | Status: DC

## 2024-07-23 MED ORDER — LIDOCAINE 2% (20 MG/ML) 5 ML SYRINGE
INTRAMUSCULAR | Status: DC | PRN
  Administered 2024-07-23: 60 mg via INTRAVENOUS

## 2024-07-23 MED ORDER — LACTATED RINGERS IV SOLN: INTRAVENOUS | Status: DC | PRN

## 2024-07-23 NOTE — Op Note (Signed)
 [facility] Patient Name: Kari Mason Procedure Date: 07/23/2024 10:51 AM MRN: [id number] Date of Birth: 29-Aug-1966 Attending MD: Deatrice Dine , MD, [id number] CSN: [id number] Age: 58 Admit Type: Outpatient Procedure:                Colonoscopy Indications:              High risk colon cancer surveillance: Personal                            history of adenoma (10 mm or greater in size) Providers:                Deatrice Dine, MD, Tammy Vaught, RN, Kristine L.                            Shirlean Balm, Technician Referring MD:              Medicines:                Monitored Anesthesia Care Complications:            No immediate complications. Estimated Blood Loss:     Estimated blood loss: none. Procedure:                Pre-Anesthesia Assessment:                           - Prior to the procedure, a History and Physical                            was performed, and patient medications and                            allergies were reviewed. The patient's tolerance of                            previous anesthesia was also reviewed. The risks                            and benefits of the procedure and the sedation                            options and risks were discussed with the patient.                            All questions were answered, and informed consent                            was obtained. Prior Anticoagulants: The patient has                            taken no anticoagulant or antiplatelet agents. ASA                            Grade Assessment: II - A patient with mild systemic  disease. After reviewing the risks and benefits,                            the patient was deemed in satisfactory condition to                            undergo the procedure.                           After obtaining informed consent, the colonoscope                            was passed under direct vision. Throughout the                             procedure, the patient's blood pressure, pulse, and                            oxygen saturations were monitored continuously. The                            [id number] ([id number]) Colon was introduced through                            the anus and advanced to the the cecum, identified                            by appendiceal orifice and ileocecal valve. The                            colonoscopy was performed without difficulty. The                            patient tolerated the procedure well. The quality                            of the bowel preparation was evaluated using the                            BBPS [facility] Bowel Preparation Scale) with scores                            of: Right Colon = 3, Transverse Colon = 3 and Left                            Colon = 3 (entire mucosa seen well with no residual                            staining, small fragments of stool or opaque                            liquid). The total BBPS score equals 9. The  ileocecal valve, appendiceal orifice, and rectum                            were photographed. Scope In: 11:09:06 AM Scope Out: 11:22:44 AM Scope Withdrawal Time: 0 hours 11 minutes 16 seconds  Total Procedure Duration: 0 hours 13 minutes 38 seconds  Findings:      The perianal and digital rectal examinations were normal.      The exam was otherwise without abnormality.      Non-bleeding internal hemorrhoids were found during retroflexion. The       hemorrhoids were small. Impression:               - The examination was otherwise normal.                           - Non-bleeding internal hemorrhoids.                           - No specimens collected. Moderate Sedation:      Per Anesthesia Care Recommendation:           - Patient has a contact number available for                            emergencies. The signs and symptoms of potential                            delayed complications were discussed with the                             patient. Return to normal activities tomorrow.                            Written discharge instructions were provided to the                            patient.                           - Resume previous diet.                           - Continue present medications.                           - Repeat colonoscopy in 5 years for surveillance.                           - Return to primary care physician as previously                            scheduled. Procedure Code(s):        --- Professional ---                           [phone], Colonoscopy, flexible; diagnostic, including  collection of specimen(s) by brushing or washing,                            when performed (separate procedure) Diagnosis Code(s):        --- Professional ---                           Z86.010, Personal history of colonic polyps                           K64.8, Other hemorrhoids CPT copyright 2022 American Medical Association. All rights reserved. The codes documented in this report are preliminary and upon coder review may  be revised to meet current compliance requirements. Deatrice Dine, MD Deatrice Dine, MD 07/23/2024 11:26:40 AM This report has been signed electronically. Number of Addenda: 0

## 2024-07-23 NOTE — H&P (Signed)
 Primary Care Physician:  Antonetta Rollene BRAVO, MD Primary Gastroenterologist:  Dr. Cinderella  Pre-Procedure History & Physical: HPI:  Kari Mason is a 58 y.o. female is here for a colonoscopy for colon polyp surveillance , history of advance adenoma.  Patient denies any family history of colorectal cancer.  No melena or hematochezia.  No abdominal pain or unintentional weight loss.  No change in bowel habits.  Overall feels well from a GI standpoint.  ovember, 2016 and  found to have a small and 20 mm tubular adenoma.   2020 - The entire examined colon is normal. - External hemorrhoids. - Anal papilla( e) were hypertrophied.  Past Medical History:  Diagnosis Date   Allergy    GERD (gastroesophageal reflux disease)    Hypertension     Past Surgical History:  Procedure Laterality Date   COLONOSCOPY N/A 10/15/2015   Procedure: COLONOSCOPY;  Surgeon: Claudis RAYMOND Rivet, MD;  Location: [facility];  Service: Endoscopy;  Laterality: N/A;  1200   COLONOSCOPY N/A 12/07/2018   Procedure: COLONOSCOPY;  Surgeon: Rivet Claudis RAYMOND, MD;  Location: [facility];  Service: Endoscopy;  Laterality: N/A;  1:45   TUBAL LIGATION      Prior to Admission medications   Medication Sig Start Date End Date Taking? Authorizing Provider  amLODipine  (NORVASC ) 5 MG tablet Take 1 tablet by mouth once daily 05/02/24  Yes Antonetta Rollene BRAVO, MD  loratadine  (CLARITIN ) 10 MG tablet Take 1 tablet (10 mg total) by mouth daily. 05/22/24  Yes Antonetta Rollene BRAVO, MD  meloxicam  (MOBIC ) 15 MG tablet TAKE 1 TABLET BY MOUTH THREE TIMES A WEEK AS NEEDED FOR  UNCONTROLLED  PAIN. 05/22/24  Yes Antonetta Rollene BRAVO, MD  Multiple Vitamin (MULTIVITAMIN WITH MINERALS) TABS tablet Take 1 tablet by mouth daily.   Yes [provider]  triamterene -hydrochlorothiazide  (MAXZIDE) 75-50 MG tablet Take 1 tablet by mouth once daily 05/02/24  Yes Antonetta Rollene BRAVO, MD    Allergies as of 06/15/2024 - Review Complete 05/22/2024  Allergen  Reaction Noted   Lisinopril  Cough 11/12/2020    Family History  Problem Relation Age of Onset   Hypertension Father    Diabetes Sister    Hypertension Sister    Hypertension Sister     Social History   Socioeconomic History   Marital status: Widowed    Spouse name: Not on file   Number of children: Not on file   Years of education: Not on file   Highest education level: Not on file  Occupational History   Not on file  Tobacco Use   Smoking status: Never   Smokeless tobacco: Never  Vaping Use   Vaping status: Never Used  Substance and Sexual Activity   Alcohol use: No   Drug use: No   Sexual activity: Not on file  Other Topics Concern   Not on file  Social History Narrative   Not on file   Social Drivers of Health   Financial Resource Strain: Not on file  Food Insecurity: Not on file  Transportation Needs: Not on file  Physical Activity: Not on file  Stress: Not on file  Social Connections: Not on file  Intimate Partner Violence: Not on file    Review of Systems: See HPI, otherwise negative ROS  Physical Exam: Vital signs in last 24 hours: Temp:  [98.2 F (36.8 C)] 98.2 F (36.8 C) (08/18 0945) Pulse Rate:  [96] 96 (08/18 0945) Resp:  [15] 15 (08/18 0945) BP: (145)/(96)  145/96 (08/18 0945) SpO2:  [98 %] 98 % (08/18 0945) Weight:  [87.5 kg] 87.5 kg (08/18 0945)   General:   Alert,  Well-developed, well-nourished, pleasant and cooperative in NAD Head:  Normocephalic and atraumatic. Eyes:  Sclera clear, no icterus.   Conjunctiva pink. Ears:  Normal auditory acuity. Nose:  No deformity, discharge,  or lesions. Msk:  Symmetrical without gross deformities. Normal posture. Extremities:  Without clubbing or edema. Neurologic:  Alert and  oriented x4;  grossly normal neurologically. Skin:  Intact without significant lesions or rashes. Psych:  Alert and cooperative. Normal mood and affect.  Impression/Plan: Kari Mason is a 58 y.o. female is here for a  colonoscopy for colon polyp surveillance , history of advance adenoma.    The risks of the procedure including infection, bleed, or perforation as well as benefits, limitations, alternatives and imponderables have been reviewed with the patient. Questions have been answered. All parties agreeable.

## 2024-07-23 NOTE — Anesthesia Preprocedure Evaluation (Signed)
 Anesthesia Evaluation  Patient identified by MRN, date of birth, ID band Patient awake    Reviewed: Allergy & Precautions, H&P , NPO status , Patient's Chart, lab work & pertinent test results, reviewed documented beta blocker date and time   Airway Mallampati: II  TM Distance: >3 FB Neck ROM: full    Dental no notable dental hx. (+) Dental Advisory Given, Teeth Intact   Pulmonary neg pulmonary ROS   Pulmonary exam normal breath sounds clear to auscultation       Cardiovascular Exercise Tolerance: Good hypertension, Normal cardiovascular exam Rhythm:regular Rate:Normal     Neuro/Psych negative neurological ROS  negative psych ROS   GI/Hepatic Neg liver ROS,GERD  ,,  Endo/Other  Impaired glucose tolerance  Renal/GU negative Renal ROS  negative genitourinary   Musculoskeletal   Abdominal   Peds  Hematology negative hematology ROS (+)   Anesthesia Other Findings   Reproductive/Obstetrics negative OB ROS                              Anesthesia Physical Anesthesia Plan  ASA: 2  Anesthesia Plan: General   Post-op Pain Management: Minimal or no pain anticipated   Induction: Intravenous  PONV Risk Score and Plan: Propofol  infusion  Airway Management Planned: Nasal Cannula and Natural Airway  Additional Equipment: None  Intra-op Plan:   Post-operative Plan:   Informed Consent: I have reviewed the patients History and Physical, chart, labs and discussed the procedure including the risks, benefits and alternatives for the proposed anesthesia with the patient or authorized representative who has indicated his/her understanding and acceptance.     Dental Advisory Given  Plan Discussed with: CRNA  Anesthesia Plan Comments:          Anesthesia Quick Evaluation

## 2024-07-23 NOTE — Transfer of Care (Signed)
 Immediate Anesthesia Transfer of Care Note  Patient: Kari Mason  Procedure(s) Performed: COLONOSCOPY  Patient Location: PACU  Anesthesia Type:General  Level of Consciousness: awake, alert , oriented, and patient cooperative  Airway & Oxygen Therapy: Patient Spontanous Breathing  Post-op Assessment: Report given to RN, Post -op Vital signs reviewed and stable, and Patient moving all extremities X 4  Post vital signs: Reviewed and stable  Last Vitals:  Vitals Value Taken Time  BP 98/53 07/23/24 11:25  Temp 36.7 C 07/23/24 11:25  Pulse 79 07/23/24 11:25  Resp 19 07/23/24 11:25  SpO2 97 % 07/23/24 11:25    Last Pain:  Vitals:   07/23/24 1127  TempSrc:   PainSc: 0-No pain      Patients Stated Pain Goal: 5 (07/23/24 0945)  Complications: No notable events documented.

## 2024-07-23 NOTE — Discharge Instructions (Signed)

## 2024-07-23 NOTE — Anesthesia Postprocedure Evaluation (Signed)
 Anesthesia Post Note  Patient: Kari Mason  Procedure(s) Performed: COLONOSCOPY  Patient location during evaluation: Endoscopy Anesthesia Type: General Level of consciousness: awake and alert Pain management: pain level controlled Vital Signs Assessment: post-procedure vital signs reviewed and stable Respiratory status: spontaneous breathing, nonlabored ventilation and respiratory function stable Cardiovascular status: stable Anesthetic complications: no   There were no known notable events for this encounter.   Last Vitals:  Vitals:   07/23/24 0945 07/23/24 1125  BP: (!) 145/96 (!) 98/53  Pulse: 96 79  Resp: 15 19  Temp: 36.8 C 36.7 C  SpO2: 98% 97%    Last Pain:  Vitals:   07/23/24 1127  TempSrc:   PainSc: 0-No pain                 Orie Baxendale L Tashira Torre

## 2024-07-24 ENCOUNTER — Encounter (HOSPITAL_COMMUNITY): Payer: Self-pay | Admitting: Gastroenterology

## 2024-07-28 ENCOUNTER — Other Ambulatory Visit: Payer: Self-pay | Admitting: Family Medicine

## 2024-08-07 ENCOUNTER — Ambulatory Visit

## 2024-08-10 ENCOUNTER — Ambulatory Visit
Admission: RE | Admit: 2024-08-10 | Discharge: 2024-08-10 | Disposition: A | Discharge status: Home / Self Care | Source: Ambulatory Visit | Attending: Family Medicine | Admitting: Family Medicine

## 2024-08-10 DIAGNOSIS — Z1231 Encounter for screening mammogram for malignant neoplasm of breast: Secondary | ICD-10-CM

## 2024-11-11 ENCOUNTER — Other Ambulatory Visit: Payer: Self-pay | Admitting: Family Medicine

## 2024-11-23 ENCOUNTER — Encounter: Payer: Self-pay | Admitting: Family Medicine

## 2024-11-23 ENCOUNTER — Ambulatory Visit: Admitting: Family Medicine

## 2024-11-23 VITALS — BP 124/84 | HR 90 | Resp 16 | Ht 63.0 in | Wt 190.0 lb

## 2024-11-23 DIAGNOSIS — D126 Benign neoplasm of colon, unspecified: Secondary | ICD-10-CM

## 2024-11-23 DIAGNOSIS — J309 Allergic rhinitis, unspecified: Secondary | ICD-10-CM | POA: Diagnosis not present

## 2024-11-23 DIAGNOSIS — Z2821 Immunization not carried out because of patient refusal: Secondary | ICD-10-CM | POA: Diagnosis not present

## 2024-11-23 DIAGNOSIS — I10 Essential (primary) hypertension: Secondary | ICD-10-CM

## 2024-11-23 DIAGNOSIS — Z0001 Encounter for general adult medical examination with abnormal findings: Secondary | ICD-10-CM | POA: Diagnosis not present

## 2024-11-23 DIAGNOSIS — Z13220 Encounter for screening for lipoid disorders: Secondary | ICD-10-CM

## 2024-11-23 DIAGNOSIS — R7302 Impaired glucose tolerance (oral): Secondary | ICD-10-CM | POA: Diagnosis not present

## 2024-11-23 MED ORDER — TRIAMTERENE-HCTZ 75-50 MG PO TABS: 1.0000 | ORAL_TABLET | Freq: Every day | ORAL | 2 refills | Status: DC

## 2024-11-23 MED ORDER — AMLODIPINE BESYLATE 5 MG PO TABS: 5.0000 mg | ORAL_TABLET | Freq: Every day | ORAL | 2 refills | Status: AC

## 2024-11-23 MED ORDER — AZELASTINE HCL 0.1 % NA SOLN: 2.0000 | Freq: Two times a day (BID) | NASAL | 12 refills | Status: AC

## 2024-11-23 MED ORDER — LORATADINE 10 MG PO TABS: 10.0000 mg | ORAL_TABLET | Freq: Every day | ORAL | 3 refills | Status: AC

## 2024-11-23 MED ORDER — MELOXICAM 15 MG PO TABS: 15.0000 mg | ORAL_TABLET | Freq: Every day | ORAL | 5 refills | Status: AC

## 2024-11-23 MED ORDER — CHLORPHENIRAMINE MALEATE 4 MG PO TABS: 4.0000 mg | ORAL_TABLET | Freq: Two times a day (BID) | ORAL | 0 refills | Status: AC | PRN

## 2024-11-23 NOTE — Patient Instructions (Addendum)
 F/U in 6 months   Fasting lipid, cmp and EGFr and HBA1C last week in December  New is astellin and chlorpheniramine  for allergies  Keep up the great work!  Reconsider vaccines  Thanks for choosing  Primary Care, we consider it a privelige to serve you.

## 2024-11-25 ENCOUNTER — Encounter: Payer: Self-pay | Admitting: Family Medicine

## 2024-11-25 DIAGNOSIS — Z13220 Encounter for screening for lipoid disorders: Secondary | ICD-10-CM | POA: Insufficient documentation

## 2024-11-25 DIAGNOSIS — Z2821 Immunization not carried out because of patient refusal: Secondary | ICD-10-CM | POA: Insufficient documentation

## 2024-11-25 NOTE — Progress Notes (Signed)
" ° ° °  Kari Mason     MRN: [id number]      DOB: [birth year]  Chief Complaint  Patient presents with   Annual Exam    Cpe    Sinus Problem    Pt complains of sinus drainage and sinus pressure x1 week     HPI: Patient is in for annual physical exam. Concern as above. Recent labs,  are reviewed. Immunization is reviewed , and  declines a;ll vaccines  PE: BP 124/84   Pulse 90   Resp 16   Ht 5' 3 (1.6 m)   Wt 190 lb (86.2 kg)   LMP 11/16/2018   SpO2 97%   BMI 33.66 kg/m   Pleasant  female, alert and oriented x 3, in no cardio-pulmonary distress. Afebrile. HEENT No facial trauma or asymetry. Sinuses non tender.  Extra occullar muscles intact.. External ears normal, . Neck: supple, no adenopathy,JVD or thyromegaly.No bruits.  Chest: Clear to ascultation bilaterally.No crackles or wheezes. Non tender to palpation    Cardiovascular system; Heart sounds normal,  S1 and  S2 ,no S3.  No murmur, or thrill. Apical beat not displaced Peripheral pulses normal.  Abdomen: Soft, non tender, no organomegaly or masses. No bruits. Bowel sounds normal. No guarding, tenderness or rebound.   Musculoskeletal exam: Full ROM of spine, hips , shoulders and knees. No deformity ,swelling or crepitus noted. No muscle wasting or atrophy.   Neurologic: Cranial nerves 2 to 12 intact. Power, tone ,sensation and reflexes normal throughout. No disturbance in gait. No tremor.  Skin: Intact, no ulceration, erythema , scaling or rash noted. Pigmentation normal throughout  Psych; Normal mood and affect. Judgement and concentration normal   Assessment & Plan:  Annual visit for general adult medical examination with abnormal findings Annual exam as documented. Counseling done  re healthy lifestyle involving commitment to 150 minutes exercise per week, heart healthy diet, and attaining healthy weight.The importance of adequate sleep also discussed. Regular seat belt use and home safety,  is also discussed. Changes in health habits are decided on by the patient with goals and time frames  set for achieving them. Immunization and cancer screening needs are specifically addressed at this visit.   Allergic rhinitis Uncontrolled add daily Astelin  and chlorpheniramine  as needed  Tubular adenoma of colon Next colonoscopy due 07/2029  Immunization declined Declines ALL vaccines Re education done re vaccine safey and benefit  "

## 2024-11-25 NOTE — Assessment & Plan Note (Signed)
 Declines ALL vaccines Re education done re vaccine safey and benefit

## 2024-11-25 NOTE — Assessment & Plan Note (Signed)
 Next colonoscopy due 07/2029

## 2024-11-25 NOTE — Assessment & Plan Note (Signed)

## 2024-11-25 NOTE — Assessment & Plan Note (Signed)
 Uncontrolled add daily Astelin  and chlorpheniramine  as needed

## 2025-01-02 ENCOUNTER — Other Ambulatory Visit: Payer: Self-pay

## 2025-01-02 ENCOUNTER — Ambulatory Visit: Payer: Self-pay | Admitting: Family Medicine

## 2025-01-02 MED ORDER — TRIAMTERENE-HCTZ 75-50 MG PO TABS: 1.0000 | ORAL_TABLET | Freq: Every day | ORAL | 2 refills | Status: AC

## 2025-01-02 NOTE — Telephone Encounter (Signed)
 FYI Only or Action Required?: Action required by provider: medication refill request.  Patient was last seen in primary care on 11/23/2024 by Antonetta Rollene BRAVO, MD.  Called Nurse Triage reporting Dizziness.  Symptoms began Symptoms on Sunday 12/30/24 only.  Interventions attempted: Rest, hydration, or home remedies.  Symptoms are: completely resolved.  Triage Disposition: Call PCP Now  Patient/caregiver understands and will follow disposition?: Yes  Reason for Disposition  [1] Prescription refill request for ESSENTIAL medicine (i.e., likelihood of harm to patient if not taken) AND [2] triager unable to refill per department policy  Additional Information  Negative: Taking a medicine that could cause dizziness (e.g., blood pressure medications, diuretics)    Out of medications  Answer Assessment - Initial Assessment Questions Patient calling in to request an early refill of Maxzide. She was at her daughters house for the weekend and misplaced her medication. She experienced mild lightheadedness on Sunday that was mild and resolved. She has not measured her blood pressure. Asymptomatic today. Requesting refill of Maxzide today to [facility].      1. DESCRIPTION: Describe your dizziness.     lightheaded 2. LIGHTHEADED: Do you feel lightheaded? (e.g., somewhat faint, woozy, weak upon standing)     Mild Lightheaded-this weekend  3. VERTIGO: Do you feel like either you or the room is spinning or tilting? (i.e., vertigo)     denies 4. SEVERITY: How bad is it?  Do you feel like you are going to faint? Can you stand and walk?     mild 5. ONSET:  When did the dizziness begin?      6. AGGRAVATING FACTORS: Does anything make it worse? (e.g., standing, change in head position)     Asymptomatic  7. HEART RATE: Can you tell me your heart rate? How many beats in 15 seconds?  (Note: Not all patients can do this.)       Feels normal 8. CAUSE: What do you think is  causing the dizziness? (e.g., decreased fluids or food, diarrhea, emotional distress, heat exposure, new medicine, sudden standing, vomiting; unknown)     Ran out of blood pressure medication 9. RECURRENT SYMPTOM: Have you had dizziness before? If Yes, ask: When was the last time? What happened that time?     no 10. OTHER SYMPTOMS: Do you have any other symptoms? (e.g., fever, chest pain, vomiting, diarrhea, bleeding)       Denies all other symptoms  Protocols used: Dizziness - Lightheadedness-A-AH, Medication Refill and Renewal Call-A-AH Message from [place] [facility] sent at 01/02/2025  8:30 AM EST  Summary: light headed, dizzy  high BP   Reason for Triage: Patient is out of medication triamterene -hydrochlorothiazide  (MAXZIDE) 75-50 MG tablet did not pack enough. Patient has not taken BP medication since 12/30/24  Now light headed, dizzy

## 2025-01-02 NOTE — Telephone Encounter (Signed)
 Sent!

## 2025-05-24 ENCOUNTER — Ambulatory Visit: Payer: Self-pay | Admitting: Family Medicine
